# Patient Record
Sex: Male | Born: 1945 | Race: White | Hispanic: Yes | Marital: Single | Smoking: Former smoker
Health system: Southern US, Community
[De-identification: ages and names within clinical notes are randomized; demographics above are authoritative.]

## PROBLEM LIST (undated history)

## (undated) DIAGNOSIS — Z7901 Long term (current) use of anticoagulants: Secondary | ICD-10-CM

## (undated) DIAGNOSIS — Z87891 Personal history of nicotine dependence: Secondary | ICD-10-CM

## (undated) DIAGNOSIS — H409 Unspecified glaucoma: Secondary | ICD-10-CM

## (undated) DIAGNOSIS — G4733 Obstructive sleep apnea (adult) (pediatric): Secondary | ICD-10-CM

## (undated) DIAGNOSIS — E782 Mixed hyperlipidemia: Secondary | ICD-10-CM

## (undated) DIAGNOSIS — I2510 Atherosclerotic heart disease of native coronary artery without angina pectoris: Secondary | ICD-10-CM

## (undated) DIAGNOSIS — Z9989 Dependence on other enabling machines and devices: Secondary | ICD-10-CM

## (undated) DIAGNOSIS — Z950 Presence of cardiac pacemaker: Secondary | ICD-10-CM

## (undated) DIAGNOSIS — J310 Chronic rhinitis: Secondary | ICD-10-CM

## (undated) DIAGNOSIS — I4821 Permanent atrial fibrillation: Secondary | ICD-10-CM

## (undated) HISTORY — DX: Obstructive sleep apnea (adult) (pediatric): Z99.89

## (undated) HISTORY — DX: Presence of cardiac pacemaker: Z95.0

## (undated) HISTORY — DX: Unspecified glaucoma: H40.9

## (undated) HISTORY — DX: Obstructive sleep apnea (adult) (pediatric): G47.33

## (undated) HISTORY — DX: Long term (current) use of anticoagulants: Z79.01

## (undated) HISTORY — DX: Atherosclerotic heart disease of native coronary artery without angina pectoris: I25.10

## (undated) HISTORY — DX: Permanent atrial fibrillation: I48.21

## (undated) HISTORY — DX: Mixed hyperlipidemia: E78.2

## (undated) HISTORY — DX: Personal history of nicotine dependence: Z87.891

## (undated) HISTORY — DX: Chronic rhinitis: J31.0

---

## 2010-10-11 DIAGNOSIS — G4733 Obstructive sleep apnea (adult) (pediatric): Secondary | ICD-10-CM | POA: Insufficient documentation

## 2010-10-11 DIAGNOSIS — I10 Essential (primary) hypertension: Secondary | ICD-10-CM | POA: Insufficient documentation

## 2010-10-11 DIAGNOSIS — N400 Enlarged prostate without lower urinary tract symptoms: Secondary | ICD-10-CM | POA: Insufficient documentation

## 2010-10-11 DIAGNOSIS — E782 Mixed hyperlipidemia: Secondary | ICD-10-CM | POA: Insufficient documentation

## 2010-10-11 DIAGNOSIS — C4490 Unspecified malignant neoplasm of skin, unspecified: Secondary | ICD-10-CM | POA: Insufficient documentation

## 2010-10-11 DIAGNOSIS — J310 Chronic rhinitis: Secondary | ICD-10-CM | POA: Insufficient documentation

## 2010-10-11 HISTORY — DX: Benign prostatic hyperplasia without lower urinary tract symptoms: N40.0

## 2011-02-06 DIAGNOSIS — Z7901 Long term (current) use of anticoagulants: Secondary | ICD-10-CM | POA: Insufficient documentation

## 2011-02-06 DIAGNOSIS — I517 Cardiomegaly: Secondary | ICD-10-CM | POA: Insufficient documentation

## 2011-04-17 DIAGNOSIS — R32 Unspecified urinary incontinence: Secondary | ICD-10-CM | POA: Insufficient documentation

## 2011-04-24 DIAGNOSIS — E119 Type 2 diabetes mellitus without complications: Secondary | ICD-10-CM | POA: Insufficient documentation

## 2011-04-28 DIAGNOSIS — I2510 Atherosclerotic heart disease of native coronary artery without angina pectoris: Secondary | ICD-10-CM | POA: Insufficient documentation

## 2011-04-28 DIAGNOSIS — Z955 Presence of coronary angioplasty implant and graft: Secondary | ICD-10-CM | POA: Insufficient documentation

## 2011-05-04 DIAGNOSIS — H409 Unspecified glaucoma: Secondary | ICD-10-CM | POA: Insufficient documentation

## 2011-05-04 DIAGNOSIS — Z87891 Personal history of nicotine dependence: Secondary | ICD-10-CM | POA: Insufficient documentation

## 2011-06-29 DIAGNOSIS — Z7901 Long term (current) use of anticoagulants: Secondary | ICD-10-CM | POA: Insufficient documentation

## 2012-02-13 DIAGNOSIS — S0990XA Unspecified injury of head, initial encounter: Secondary | ICD-10-CM

## 2012-02-13 DIAGNOSIS — S0633AA Contusion and laceration of cerebrum, unspecified, with loss of consciousness status unknown, initial encounter: Secondary | ICD-10-CM | POA: Insufficient documentation

## 2012-02-13 HISTORY — DX: Unspecified injury of head, initial encounter: S09.90XA

## 2012-02-16 DIAGNOSIS — R55 Syncope and collapse: Secondary | ICD-10-CM | POA: Insufficient documentation

## 2012-02-16 HISTORY — DX: Syncope and collapse: R55

## 2013-08-14 DIAGNOSIS — I4892 Unspecified atrial flutter: Secondary | ICD-10-CM | POA: Insufficient documentation

## 2013-08-14 DIAGNOSIS — R9431 Abnormal electrocardiogram [ECG] [EKG]: Secondary | ICD-10-CM

## 2013-08-14 DIAGNOSIS — A419 Sepsis, unspecified organism: Secondary | ICD-10-CM | POA: Insufficient documentation

## 2013-08-14 DIAGNOSIS — N179 Acute kidney failure, unspecified: Secondary | ICD-10-CM | POA: Insufficient documentation

## 2013-08-14 DIAGNOSIS — N390 Urinary tract infection, site not specified: Secondary | ICD-10-CM | POA: Insufficient documentation

## 2013-08-14 HISTORY — DX: Abnormal electrocardiogram (ECG) (EKG): R94.31

## 2013-11-12 DIAGNOSIS — Z95818 Presence of other cardiac implants and grafts: Secondary | ICD-10-CM | POA: Insufficient documentation

## 2013-11-12 HISTORY — DX: Presence of other cardiac implants and grafts: Z95.818

## 2013-12-10 DIAGNOSIS — Z87898 Personal history of other specified conditions: Secondary | ICD-10-CM

## 2013-12-10 DIAGNOSIS — K219 Gastro-esophageal reflux disease without esophagitis: Secondary | ICD-10-CM

## 2013-12-10 DIAGNOSIS — N189 Chronic kidney disease, unspecified: Secondary | ICD-10-CM | POA: Insufficient documentation

## 2013-12-10 HISTORY — DX: Gastro-esophageal reflux disease without esophagitis: K21.9

## 2013-12-10 HISTORY — DX: Personal history of other specified conditions: Z87.898

## 2014-01-28 DIAGNOSIS — Z950 Presence of cardiac pacemaker: Secondary | ICD-10-CM | POA: Insufficient documentation

## 2014-01-28 DIAGNOSIS — I442 Atrioventricular block, complete: Secondary | ICD-10-CM | POA: Insufficient documentation

## 2014-01-28 DIAGNOSIS — I4821 Permanent atrial fibrillation: Secondary | ICD-10-CM | POA: Insufficient documentation

## 2014-01-28 HISTORY — DX: Atrioventricular block, complete: I44.2

## 2014-02-16 DIAGNOSIS — R809 Proteinuria, unspecified: Secondary | ICD-10-CM

## 2014-02-16 DIAGNOSIS — E876 Hypokalemia: Secondary | ICD-10-CM

## 2014-02-16 HISTORY — DX: Hypokalemia: E87.6

## 2014-02-16 HISTORY — DX: Proteinuria, unspecified: R80.9

## 2014-04-23 DIAGNOSIS — E1122 Type 2 diabetes mellitus with diabetic chronic kidney disease: Secondary | ICD-10-CM | POA: Insufficient documentation

## 2014-08-03 DIAGNOSIS — E6609 Other obesity due to excess calories: Secondary | ICD-10-CM | POA: Insufficient documentation

## 2014-09-17 DIAGNOSIS — N1831 Chronic kidney disease, stage 3a: Secondary | ICD-10-CM

## 2014-09-17 DIAGNOSIS — N183 Chronic kidney disease, stage 3 unspecified: Secondary | ICD-10-CM | POA: Insufficient documentation

## 2014-09-17 HISTORY — DX: Chronic kidney disease, stage 3a: N18.31

## 2016-05-10 DIAGNOSIS — Z9889 Other specified postprocedural states: Secondary | ICD-10-CM

## 2016-05-10 HISTORY — DX: Other specified postprocedural states: Z98.890

## 2016-05-11 DIAGNOSIS — Z9889 Other specified postprocedural states: Secondary | ICD-10-CM | POA: Insufficient documentation

## 2016-05-30 DIAGNOSIS — I498 Other specified cardiac arrhythmias: Secondary | ICD-10-CM

## 2016-05-30 HISTORY — DX: Other specified cardiac arrhythmias: I49.8

## 2016-09-05 DIAGNOSIS — Z45018 Encounter for adjustment and management of other part of cardiac pacemaker: Secondary | ICD-10-CM | POA: Insufficient documentation

## 2017-05-23 DIAGNOSIS — C4492 Squamous cell carcinoma of skin, unspecified: Secondary | ICD-10-CM

## 2017-05-23 HISTORY — DX: Squamous cell carcinoma of skin, unspecified: C44.92

## 2018-01-08 DIAGNOSIS — I4729 Other ventricular tachycardia: Secondary | ICD-10-CM | POA: Insufficient documentation

## 2018-01-08 HISTORY — DX: Other ventricular tachycardia: I47.29

## 2018-06-27 DIAGNOSIS — K869 Disease of pancreas, unspecified: Secondary | ICD-10-CM

## 2018-06-27 HISTORY — DX: Disease of pancreas, unspecified: K86.9

## 2019-02-27 DIAGNOSIS — I6523 Occlusion and stenosis of bilateral carotid arteries: Secondary | ICD-10-CM | POA: Insufficient documentation

## 2019-02-27 HISTORY — DX: Occlusion and stenosis of bilateral carotid arteries: I65.23

## 2019-11-08 DIAGNOSIS — R634 Abnormal weight loss: Secondary | ICD-10-CM

## 2019-11-08 HISTORY — DX: Abnormal weight loss: R63.4

## 2019-11-10 DIAGNOSIS — R634 Abnormal weight loss: Secondary | ICD-10-CM | POA: Insufficient documentation

## 2021-02-07 DIAGNOSIS — T82837A Hemorrhage of cardiac prosthetic devices, implants and grafts, initial encounter: Secondary | ICD-10-CM

## 2021-02-07 HISTORY — DX: Hemorrhage due to cardiac prosthetic devices, implants and grafts, initial encounter: T82.837A

## 2021-03-08 DIAGNOSIS — T82837A Hemorrhage of cardiac prosthetic devices, implants and grafts, initial encounter: Secondary | ICD-10-CM | POA: Insufficient documentation

## 2021-03-16 DIAGNOSIS — Z959 Presence of cardiac and vascular implant and graft, unspecified: Secondary | ICD-10-CM | POA: Insufficient documentation

## 2021-03-16 HISTORY — DX: Presence of cardiac and vascular implant and graft, unspecified: Z95.9

## 2021-08-23 DIAGNOSIS — E1142 Type 2 diabetes mellitus with diabetic polyneuropathy: Secondary | ICD-10-CM

## 2021-08-23 DIAGNOSIS — E119 Type 2 diabetes mellitus without complications: Secondary | ICD-10-CM | POA: Insufficient documentation

## 2021-08-23 HISTORY — DX: Type 2 diabetes mellitus with diabetic polyneuropathy: E11.42

## 2021-09-28 ENCOUNTER — Encounter: Payer: Self-pay | Admitting: *Deleted

## 2021-10-06 ENCOUNTER — Other Ambulatory Visit: Payer: Self-pay | Admitting: *Deleted

## 2021-10-12 ENCOUNTER — Inpatient Hospital Stay: Payer: Medicare Other | Attending: Oncology | Admitting: Oncology

## 2021-10-12 ENCOUNTER — Encounter: Payer: Self-pay | Admitting: Oncology

## 2021-10-12 ENCOUNTER — Inpatient Hospital Stay: Payer: Medicare Other

## 2021-10-12 ENCOUNTER — Encounter (INDEPENDENT_AMBULATORY_CARE_PROVIDER_SITE_OTHER): Payer: Self-pay

## 2021-10-12 VITALS — BP 138/64 | HR 59 | Temp 97.6°F | Resp 18 | Ht 73.0 in | Wt 253.8 lb

## 2021-10-12 DIAGNOSIS — N1831 Chronic kidney disease, stage 3a: Secondary | ICD-10-CM | POA: Diagnosis not present

## 2021-10-12 DIAGNOSIS — D472 Monoclonal gammopathy: Secondary | ICD-10-CM | POA: Diagnosis present

## 2021-10-12 DIAGNOSIS — J449 Chronic obstructive pulmonary disease, unspecified: Secondary | ICD-10-CM | POA: Insufficient documentation

## 2021-10-12 DIAGNOSIS — R319 Hematuria, unspecified: Secondary | ICD-10-CM | POA: Insufficient documentation

## 2021-10-12 LAB — COMPREHENSIVE METABOLIC PANEL
ALT: 29 U/L (ref 0–44)
AST: 28 U/L (ref 15–41)
Albumin: 4.2 g/dL (ref 3.5–5.0)
Alkaline Phosphatase: 57 U/L (ref 38–126)
Anion gap: 8 (ref 5–15)
BUN: 28 mg/dL — ABNORMAL HIGH (ref 8–23)
CO2: 27 mmol/L (ref 22–32)
Calcium: 9.3 mg/dL (ref 8.9–10.3)
Chloride: 100 mmol/L (ref 98–111)
Creatinine, Ser: 1.11 mg/dL (ref 0.61–1.24)
GFR, Estimated: >60 mL/min (ref >60)
Glucose, Bld: 105 mg/dL — ABNORMAL HIGH (ref 70–99)
Potassium: 3.7 mmol/L (ref 3.5–5.1)
Sodium: 135 mmol/L (ref 135–145)
Total Bilirubin: 1.3 mg/dL — ABNORMAL HIGH (ref 0.3–1.2)
Total Protein: 7.9 g/dL (ref 6.5–8.1)

## 2021-10-12 LAB — CBC WITH DIFFERENTIAL/PLATELET
Abs Immature Granulocytes: 0.04 10*3/uL (ref 0.00–0.07)
Basophils Absolute: 0 10*3/uL (ref 0.0–0.1)
Basophils Relative: 1 %
Eosinophils Absolute: 0.3 10*3/uL (ref 0.0–0.5)
Eosinophils Relative: 4 %
HCT: 44.4 % (ref 39.0–52.0)
Hemoglobin: 15.4 g/dL (ref 13.0–17.0)
Immature Granulocytes: 1 %
Lymphocytes Relative: 15 %
Lymphs Abs: 1.2 10*3/uL (ref 0.7–4.0)
MCH: 29.4 pg (ref 26.0–34.0)
MCHC: 34.7 g/dL (ref 30.0–36.0)
MCV: 84.7 fL (ref 80.0–100.0)
Monocytes Absolute: 0.8 10*3/uL (ref 0.1–1.0)
Monocytes Relative: 9 %
Neutro Abs: 6 10*3/uL (ref 1.7–7.7)
Neutrophils Relative %: 70 %
Platelets: 186 10*3/uL (ref 150–400)
RBC: 5.24 MIL/uL (ref 4.22–5.81)
RDW: 14.2 % (ref 11.5–15.5)
WBC: 8.4 10*3/uL (ref 4.0–10.5)
nRBC: 0 % (ref 0.0–0.2)

## 2021-10-12 NOTE — Progress Notes (Signed)
? ?Hematology/Oncology Consult note ?[place] ?[phone]) [phone] [phone] ? ?Patient Care Team: ?Oleh Genin, MD as PCP - General (Internal Medicine) ?Sindy Guadeloupe, MD as Consulting Physician (Hematology)  ? ?Name of the patient: Aaron Carr  ?[id number]  ?1946/05/02  ? ? ?Reason for referral-MGUS ?  ?Referring physician-Dr. Brigitte Pulse ? ?Date of visit: 10/12/21 ? ? 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  Blood work from 09/07/2021 showed a normal CBC with an H&H of 15.4/45.8.  Iron studies showed a normal TIBC SPEP showed a small M spike of 0.38 g B12 and folate were normal.  Immunofixation showed IgA kappa.  BMP showed mildly elevated serum calcium of 10.3 with a creatinine of 1.2 ? ?Patient is doing well overall for his age.  He has chronic hip pain and he ambulates with a cane.  Denies any changes in his appetite or weight ? ?ECOG PS- 2 ? ?Pain scale- 3 ? ? ?Review of systems- Review of Systems  ?Constitutional:  Positive for malaise/fatigue. Negative for chills, fever and weight loss.  ?HENT:  Negative for congestion, ear discharge and nosebleeds.   ?Eyes:  Negative for blurred vision.  ?Respiratory:  Negative for cough, hemoptysis, sputum production, shortness of breath and wheezing.   ?Cardiovascular:  Negative for chest pain, palpitations, orthopnea and claudication.  ?Gastrointestinal:  Negative for abdominal pain, blood in stool, constipation, diarrhea, heartburn, melena, nausea and vomiting.  ?Genitourinary:  Negative for dysuria, flank pain, frequency, hematuria and urgency.  ?Musculoskeletal:  Positive for joint pain. Negative for back pain and myalgias.  ?Skin:  Negative for rash.  ?Neurological:  Negative for dizziness, tingling, focal weakness, seizures, weakness and headaches.  ?Endo/Heme/Allergies:  Does not bruise/bleed  easily.  ?Psychiatric/Behavioral:  Negative for depression and suicidal ideas. The patient does not have insomnia.   ? ?No Known Allergies ? ?Patient Active Problem List  ? Diagnosis Date Noted  ? COPD (chronic obstructive pulmonary disease) ([place]) 10/12/2021  ? Hematuria 10/12/2021  ? Unintentional weight loss 11/10/2019  ? Carotid artery plaque, bilateral 02/27/2019  ? 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 Fitting or adjustment of cardiac pacemaker 09/05/2016  ? History of Mohs surgery for squamous cell carcinoma in situ of skin 05/11/2016  ? Albuminuria 02/16/2014  ? Hypokalemia 02/16/2014  ? Biventricular cardiac pacemaker in situ 01/28/2014  ? 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 CKD (chronic kidney disease) 12/10/2013  ? GERD (gastroesophageal reflux disease) 12/10/2013  ? H/O syncope 12/10/2013  ? Status post placement of implantable loop recorder 11/12/2013  ? Sepsis ([place]) 08/14/2013  ? AKI (acute kidney injury) ([place]) 08/14/2013  ? Lower urinary tract infectious disease 08/14/2013  ? Syncope 02/16/2012  ? Cerebral contusion ([place]) 02/13/2012  ? Head trauma 02/13/2012  ? Long term (current) use of anticoagulants 06/29/2011  ? Glaucoma 05/04/2011  ? CAD (coronary artery disease) 04/28/2011  ? Diabetes mellitus ([place]) 04/24/2011  ? Urinary incontinence 04/17/2011  ? Anticoagulated 02/06/2011  ? LVH (left ventricular hypertrophy) 02/06/2011  ? Skin cancer 10/11/2010  ? Benign essential hypertension 10/11/2010  ? BPH (benign prostatic hyperplasia) 10/11/2010  ? ? ? ?Past Medical History:  ?Diagnosis Date  ? Albuminuria 02/16/2014  ? Benign prostatic hyperplasia 10/11/2010  ? Biventricular cardiac pacemaker in situ   ? [address] Jude [place] [address]29 CRT-P (serial number [id number]) implanted 12/15/2013  ?  CAD (coronary artery disease)   ? Cardiac device in situ 03/16/2021  ? St Jude CRT-P implanted on 02/16/21 with St Jude RA, RV and LV leads implanted 12/15/2013  ? Carotid artery plaque,  bilateral 02/27/2019  ? Chronic kidney disease (CKD) stage G3a/A1, moderately decreased glomerular filtration rate (GFR) between 45-59 mL/min/1.73 square meter and albuminuria creatinine ratio less than 30 mg/g (HCC) 09/17/2014  ? 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 Ex-smoker   ? GERD (gastroesophageal reflux disease) 12/10/2013  ? Glaucoma   ? H/O syncope 12/10/2013  ? History of Mohs surgery for squamous cell carcinoma in situ of skin 05/2016  ? Hypokalemia 02/16/2014  ? Long term current use of anticoagulant therapy   ? Mixed hyperlipidemia   ? NSVT (nonsustained ventricular tachycardia) ([place]) 01/08/2018  ? OSA on CPAP   ? Pacemaker pocket hematoma 02/2021  ? 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 Pancreatic lesion 06/27/2018  ? Permanent atrial fibrillation ([place])   ? Prolonged Q-T interval on ECG 08/14/2013  ? Rhinitis   ? 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 [facility] posterior [facility] 01/2017  ? Type 2 diabetes mellitus with peripheral neuropathy ([place]) 08/23/2021  ? Unintentional weight loss 11/2019  ? ? ? ?History reviewed. No pertinent surgical history. ? ?Social History  ? ?Socioeconomic History  ? Marital status: Single  ?  Spouse name: Not on file  ? Number of children: Not on file  ? Years of education: Not on file  ? Highest education level: Not on file  ?Occupational History  ? Not on file  ?Tobacco Use  ? Smoking status: Former  ?  Packs/day: 3.00  ?  Years: 21.00  ?  Pack years: 63.00  ?  Types: Cigarettes  ?  Start date: 07/10/1968  ?  Quit date: 07/09/1998  ?  Years since quitting: 23.2  ?  Passive exposure: Never  ? Smokeless tobacco: Never  ?Vaping Use  ? Vaping Use: Never used  ?Substance and Sexual Activity  ? Alcohol use: Not Currently  ?  Alcohol/week: 1.0 standard drink  ?  Types: 1 Glasses of wine per week  ?  Comment: "social drinker wine consumption" 2-4 times a month  ? Drug  use: Not on file  ? Sexual activity: Not on file  ?Other Topics Concern  ? Not on file  ?Social History Narrative  ? Not on file  ? ?Social Determinants of Health  ? ?Financial Resource Strain: Not on file  ?Food Insecurity: Not on file  ?Transportation Needs: Not on file  ?Physical Activity: Not on file  ?Stress: Not on file  ?Social Connections: Not on file  ?Intimate Partner Violence: Not on file  ? ?  ?Family History  ?Problem Relation Age of Onset  ? Emphysema Father   ? Glaucoma Father   ? Bipolar disorder Brother   ? Skin cancer Brother   ? Skin cancer Brother   ? ? ? ?Current Outpatient Medications:  ?  amLODipine (NORVASC) 10 MG tablet, Take 1 tablet by mouth daily., Disp: , Rfl:  ?  atorvastatin (LIPITOR) 40 MG tablet, Take 1 tablet by mouth daily at 12 noon., Disp: , Rfl:  ?  doxazosin (CARDURA) 4 MG tablet, Take 1 tablet by mouth daily at 12 noon., Disp: , Rfl:  ?  fluticasone (FLONASE) 50 MCG/ACT nasal spray, Place 2 sprays into the nose daily at  12 noon., Disp: , Rfl:  ?  glipiZIDE (GLUCOTROL) 5 MG tablet, Take 1 tablet by mouth daily at 12 noon., Disp: , Rfl:  ?  losartan (COZAAR) 25 MG tablet, Take 1 tablet by mouth daily., Disp: , Rfl:  ?  umeclidinium-vilanterol (ANORO ELLIPTA) 62.5-25 MCG/ACT AEPB, Inhale into the lungs., Disp: , Rfl:  ?  warfarin (COUMADIN) 5 MG tablet, Take 1 tablet by mouth daily at 12 noon., Disp: , Rfl:  ?  albuterol (VENTOLIN HFA) 108 (90 Base) MCG/ACT inhaler, Inhale 2 puffs into the lungs daily as needed for wheezing. (Patient not taking: Reported on 10/12/2021), Disp: , Rfl:  ?  guaiFENesin (MUCINEX) 600 MG 12 hr tablet, Take 1 tablet by mouth every 12 (twelve) hours. (Patient not taking: Reported on 10/12/2021), Disp: , Rfl:  ?  ibuprofen (ADVIL) 600 MG tablet, Take 600 mg by mouth every 6 (six) hours. (Patient not taking: Reported on 10/12/2021), Disp: , Rfl:  ? ? ?Physical exam:  ?Vitals:  ? 10/12/21 1352  ?BP: 138/64  ?Pulse: (!) 59  ?Resp: 18  ?Temp: 97.6 ?F (36.4 ?C)   ?SpO2: 97%  ?Weight: 253 lb 12.8 oz (115.1 kg)  ?Height: '6\' 1"'$  (1.854 m)  ? ?Physical Exam ?Constitutional:   ?   General: He is not in acute distress. ?Cardiovascular:  ?   Rate and Rhythm: Normal rate and regula

## 2021-10-13 ENCOUNTER — Encounter: Payer: Self-pay | Admitting: Oncology

## 2021-10-13 LAB — KAPPA/LAMBDA LIGHT CHAINS
Kappa free light chain: 392.2 mg/L — ABNORMAL HIGH (ref 3.3–19.4)
Kappa, lambda light chain ratio: 25.63 — ABNORMAL HIGH (ref 0.26–1.65)
Lambda free light chains: 15.3 mg/L (ref 5.7–26.3)

## 2021-10-14 LAB — MULTIPLE MYELOMA PANEL, SERUM
Albumin SerPl Elph-Mcnc: 4 g/dL (ref 2.9–4.4)
Albumin/Glob SerPl: 1.3 (ref 0.7–1.7)
Alpha 1: 0.2 g/dL (ref 0.0–0.4)
Alpha2 Glob SerPl Elph-Mcnc: 0.8 g/dL (ref 0.4–1.0)
B-Globulin SerPl Elph-Mcnc: 0.9 g/dL (ref 0.7–1.3)
Gamma Glob SerPl Elph-Mcnc: 1.1 g/dL (ref 0.4–1.8)
Globulin, Total: 3.1 g/dL (ref 2.2–3.9)
IgA: 556 mg/dL — ABNORMAL HIGH (ref 61–437)
IgG (Immunoglobin G), Serum: 981 mg/dL (ref 603–1613)
IgM (Immunoglobulin M), Srm: 32 mg/dL (ref 15–143)
M Protein SerPl Elph-Mcnc: 0.4 g/dL — ABNORMAL HIGH
Total Protein ELP: 7.1 g/dL (ref 6.0–8.5)

## 2021-10-16 DIAGNOSIS — D472 Monoclonal gammopathy: Secondary | ICD-10-CM | POA: Diagnosis not present

## 2021-10-17 ENCOUNTER — Other Ambulatory Visit: Payer: Self-pay

## 2021-10-17 DIAGNOSIS — D472 Monoclonal gammopathy: Secondary | ICD-10-CM

## 2021-10-20 LAB — IFE+PROTEIN ELECTRO, 24-HR UR
% BETA, Urine: 13.1 %
ALPHA 1 URINE: 3.2 %
Albumin, U: 61.6 %
Alpha 2, Urine: 3.9 %
GAMMA GLOBULIN URINE: 18.2 %
M-SPIKE %, Urine: 8.2 % — ABNORMAL HIGH
M-Spike, Mg/24 Hr: 76 mg/24 hr — ABNORMAL HIGH
Total Protein, Urine-Ur/day: 927 mg/24 hr — ABNORMAL HIGH (ref 30–150)
Total Protein, Urine: 40.3 mg/dL
Total Volume: 2300

## 2021-10-21 ENCOUNTER — Telehealth: Payer: Self-pay | Admitting: Oncology

## 2021-10-21 ENCOUNTER — Other Ambulatory Visit: Payer: Self-pay | Admitting: *Deleted

## 2021-10-21 DIAGNOSIS — D472 Monoclonal gammopathy: Secondary | ICD-10-CM

## 2021-10-21 NOTE — Telephone Encounter (Signed)
Thank you, I just put in the order today ?

## 2021-10-21 NOTE — Progress Notes (Signed)
Aaron Carr called pt and I put order in and they was given that it does not need appt , just go sometime in few weeks and it get done before MD appt ?

## 2021-10-21 NOTE — Telephone Encounter (Signed)
Spoke with patient's daughter to let her know that patient needs a Bone survey prior to his appointment to see Dr. Janese Banks on 5/2. I explained that this is a walk in and does not require an appointment. I had left a voicemail with patient but wanted to make sure he got the message. She will confirm with patient.  ?

## 2021-10-31 ENCOUNTER — Ambulatory Visit
Admission: RE | Admit: 2021-10-31 | Discharge: 2021-10-31 | Disposition: A | Discharge status: Home / Self Care | Payer: Medicare Other | Source: Ambulatory Visit | Attending: Oncology | Admitting: Oncology

## 2021-10-31 ENCOUNTER — Ambulatory Visit: Payer: Medicare Other | Admitting: Oncology

## 2021-10-31 DIAGNOSIS — D472 Monoclonal gammopathy: Secondary | ICD-10-CM | POA: Insufficient documentation

## 2021-11-08 ENCOUNTER — Inpatient Hospital Stay: Payer: Medicare Other | Attending: Oncology | Admitting: Oncology

## 2021-11-08 ENCOUNTER — Encounter: Payer: Self-pay | Admitting: Oncology

## 2021-11-08 VITALS — BP 124/74 | HR 60 | Resp 20 | Wt 255.2 lb

## 2021-11-08 DIAGNOSIS — R768 Other specified abnormal immunological findings in serum: Secondary | ICD-10-CM | POA: Diagnosis not present

## 2021-11-08 DIAGNOSIS — D472 Monoclonal gammopathy: Secondary | ICD-10-CM | POA: Diagnosis present

## 2021-11-08 DIAGNOSIS — N1831 Chronic kidney disease, stage 3a: Secondary | ICD-10-CM | POA: Insufficient documentation

## 2021-11-08 DIAGNOSIS — Z87891 Personal history of nicotine dependence: Secondary | ICD-10-CM | POA: Insufficient documentation

## 2021-11-09 ENCOUNTER — Telehealth: Payer: Self-pay

## 2021-11-09 NOTE — Telephone Encounter (Signed)
Pt understands and agrees with BMB instructions arrival time at 7:30am and NPO 6-8 hours prior except heart/seizure/BP medications if applicable.  ? ?

## 2021-11-11 NOTE — Progress Notes (Signed)
? ? ? ?Hematology/Oncology Consult note ?[place]  ?Telephone:(336) [phone] [phone] ? ?Patient Care Team: ?Oleh Genin, MD as PCP - General (Internal Medicine) ?Sindy Guadeloupe, MD as Consulting Physician (Hematology)  ? ?Name of the patient: Aaron Carr  ?[id number]  ?October 26, 1945  ? ?Date of visit: 11/11/21 ? ?Diagnosis-IgA kappa MGUS ? ?Chief complaint/ Reason for visit-discuss results of blood work ? ?Heme/Onc history: Patient is a 76 year old male with a past medical history significant for CKD, CAD, BPH, hyperlipidemia who has been referred for MGUS.  Blood work from 09/07/2021 showed a normal CBC with an H&H of 15.4/45.8.  Iron studies showed a normal TIBC SPEP showed a small M spike of 0.38 g B12 and folate were normal.  Immunofixation showed IgA kappa.  BMP showed mildly elevated serum calcium of 10.3 with a creatinine of 1.2 ?  ? ?Interval history-patient feels at his baseline state of health.  Denies any new complaints at this time other than chronic fatigue and joint pain ? ?ECOG PS- 1 ?Pain scale- 2 ?Opioid associated constipation- no ? ?Review of systems- Review of Systems  ?Constitutional:  Positive for malaise/fatigue. Negative for chills, fever and weight loss.  ?HENT:  Negative for congestion, ear discharge and nosebleeds.   ?Eyes:  Negative for blurred vision.  ?Respiratory:  Negative for cough, hemoptysis, sputum production, shortness of breath and wheezing.   ?Cardiovascular:  Negative for chest pain, palpitations, orthopnea and claudication.  ?Gastrointestinal:  Negative for abdominal pain, blood in stool, constipation, diarrhea, heartburn, melena, nausea and vomiting.  ?Genitourinary:  Negative for dysuria, flank pain, frequency, hematuria and urgency.  ?Musculoskeletal:  Positive for joint pain. Negative for back pain and myalgias.  ?Skin:  Negative for rash.  ?Neurological:  Negative for dizziness, tingling, focal weakness, seizures, weakness and  headaches.  ?Endo/Heme/Allergies:  Does not bruise/bleed easily.  ?Psychiatric/Behavioral:  Negative for depression and suicidal ideas. The patient does not have insomnia.    ? ? ? ?No Known Allergies ? ? ?Past Medical History:  ?Diagnosis Date  ? Albuminuria 02/16/2014  ? Benign prostatic hyperplasia 10/11/2010  ? Biventricular cardiac pacemaker in situ   ? [address] Jude [place] [address]606 CRT-P (serial number [id number]) implanted 12/15/2013  ? CAD (coronary artery disease)   ? Cardiac device in situ 03/16/2021  ? St Jude CRT-P implanted on 02/16/21 with St Jude RA, RV and LV leads implanted 12/15/2013  ? Carotid artery plaque, bilateral 02/27/2019  ? Chronic kidney disease (CKD) stage G3a/A1, moderately decreased glomerular filtration rate (GFR) between 45-59 mL/min/1.73 square meter and albuminuria creatinine ratio less than 30 mg/g ([facility]CC) 09/17/2014  ? 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 Ex-smoker   ? GERD (gastroesophageal reflux disease) 12/10/2013  ? Glaucoma   ? H/O syncope 12/10/2013  ? History of Mohs surgery for squamous cell carcinoma in situ of skin 05/2016  ? Hypokalemia 02/16/2014  ? Long term current use of anticoagulant therapy   ? Mixed hyperlipidemia   ? NSVT (nonsustained ventricular tachycardia) ([place]) 01/08/2018  ? OSA on CPAP   ? Pacemaker pocket hematoma 02/2021  ? 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 Pancreatic lesion 06/27/2018  ? Permanent atrial fibrillation ([place])   ? Prolonged Q-T interval on ECG 08/14/2013  ? Rhinitis   ? 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 [facility] posterior [facility] 01/2017  ? Type 2 diabetes mellitus with  peripheral neuropathy ([place]) 08/23/2021  ? Unintentional weight loss 11/2019  ? ? ? ?History reviewed. No pertinent surgical history. ? ?Social History  ? ?Socioeconomic History  ? Marital status: Single  ?  Spouse name: Not on file  ? Number of children: Not  on file  ? Years of education: Not on file  ? Highest education level: Not on file  ?Occupational History  ? Not on file  ?Tobacco Use  ? Smoking status: Former  ?  Packs/day: 3.00  ?  Years: 21.00  ?  Pack years: 63.00  ?  Types: Cigarettes  ?  Start date: 07/10/1968  ?  Quit date: 07/09/1998  ?  Years since quitting: 23.3  ?  Passive exposure: Never  ? Smokeless tobacco: Never  ?Vaping Use  ? Vaping Use: Never used  ?Substance and Sexual Activity  ? Alcohol use: Not Currently  ?  Alcohol/week: 1.0 standard drink  ?  Types: 1 Glasses of wine per week  ?  Comment: "social drinker wine consumption" 2-4 times a month  ? Drug use: Not on file  ? Sexual activity: Not on file  ?Other Topics Concern  ? Not on file  ?Social History Narrative  ? Not on file  ? ?Social Determinants of Health  ? ?Financial Resource Strain: Not on file  ?Food Insecurity: Not on file  ?Transportation Needs: Not on file  ?Physical Activity: Not on file  ?Stress: Not on file  ?Social Connections: Not on file  ?Intimate Partner Violence: Not on file  ? ? ?Family History  ?Problem Relation Age of Onset  ? Emphysema Father   ? Glaucoma Father   ? Bipolar disorder Brother   ? Skin cancer Brother   ? Skin cancer Brother   ? ? ? ?Current Outpatient Medications:  ?  amLODipine (NORVASC) 10 MG tablet, Take 1 tablet by mouth daily., Disp: , Rfl:  ?  atorvastatin (LIPITOR) 40 MG tablet, Take 1 tablet by mouth daily at 12 noon., Disp: , Rfl:  ?  doxazosin (CARDURA) 4 MG tablet, Take 1 tablet by mouth daily at 12 noon., Disp: , Rfl:  ?  glipiZIDE (GLUCOTROL) 5 MG tablet, Take 1 tablet by mouth daily at 12 noon., Disp: , Rfl:  ?  losartan (COZAAR) 25 MG tablet, Take 1 tablet by mouth daily., Disp: , Rfl:  ?  warfarin (COUMADIN) 5 MG tablet, Take 1 tablet by mouth daily at 12 noon., Disp: , Rfl:  ?  albuterol (VENTOLIN HFA) 108 (90 Base) MCG/ACT inhaler, Inhale 2 puffs into the lungs daily as needed for wheezing. (Patient not taking: Reported on 10/12/2021), Disp:  , Rfl:  ?  fluticasone (FLONASE) 50 MCG/ACT nasal spray, Place 2 sprays into the nose daily at 12 noon. (Patient not taking: Reported on 11/08/2021), Disp: , Rfl:  ?  guaiFENesin (MUCINEX) 600 MG 12 hr tablet, Take 1 tablet by mouth every 12 (twelve) hours. (Patient not taking: Reported on 10/12/2021), Disp: , Rfl:  ?  ibuprofen (ADVIL) 600 MG tablet, Take 600 mg by mouth every 6 (six) hours. (Patient not taking: Reported on 10/12/2021), Disp: , Rfl:  ?  umeclidinium-vilanterol (ANORO ELLIPTA) 62.5-25 MCG/ACT AEPB, Inhale into the lungs. (Patient not taking: Reported on 11/08/2021), Disp: , Rfl:  ? ?Physical exam:  ?Vitals:  ? 11/08/21 1436  ?BP: 124/74  ?Pulse: 60  ?Resp: 20  ?SpO2: 95%  ?Weight: 255 lb 3.2 oz (115.8 kg)  ? ?Physical Exam ?Constitutional:   ?   General:  He is not in acute distress. ?   Comments: Ambulates with a cane.  Appears in no acute distress  ?Cardiovascular:  ?   Rate and Rhythm: Normal rate and regular rhythm.  ?   Heart sounds: Normal heart sounds.  ?Pulmonary:  ?   Effort: Pulmonary effort is normal.  ?   Breath sounds: Normal breath sounds.  ?Abdominal:  ?   General: Bowel sounds are normal.  ?   Palpations: Abdomen is soft.  ?Skin: ?   General: Skin is warm and dry.  ?Neurological:  ?   Mental Status: He is alert and oriented to person, place, and time.  ?  ? ? ?  Latest Ref Rng & Units 10/12/2021  ?  2:15 PM  ?CMP  ?Glucose 70 - 99 mg/dL 105    ?BUN 8 - 23 mg/dL 28    ?Creatinine 0.61 - 1.24 mg/dL 1.11    ?Sodium 135 - 145 mmol/L 135    ?Potassium 3.5 - 5.1 mmol/L 3.7    ?Chloride 98 - 111 mmol/L 100    ?CO2 22 - 32 mmol/L 27    ?Calcium 8.9 - 10.3 mg/dL 9.3    ?Total Protein 6.5 - 8.1 g/dL 7.9    ?Total Bilirubin 0.3 - 1.2 mg/dL 1.3    ?Alkaline Phos 38 - 126 U/L 57    ?AST 15 - 41 U/L 28    ?ALT 0 - 44 U/L 29    ? ? ?  Latest Ref Rng & Units 10/12/2021  ?  2:15 PM  ?CBC  ?WBC 4.0 - 10.5 K/uL 8.4    ?Hemoglobin 13.0 - 17.0 g/dL 15.4    ?Hematocrit 39.0 - 52.0 % 44.4    ?Platelets 150 - 400 K/uL  186    ? ? ?No images are attached to the encounter. ? ?DG Bone Survey Met ? ?Result Date: 11/01/2021 ?CLINICAL DATA:  Monoclonal gammopathy EXAM: METASTATIC BONE SURVEY COMPARISON:  None. FINDINGS: Orlene Plum

## 2021-11-17 ENCOUNTER — Other Ambulatory Visit: Payer: Self-pay | Admitting: Internal Medicine

## 2021-11-17 DIAGNOSIS — C4492 Squamous cell carcinoma of skin, unspecified: Secondary | ICD-10-CM

## 2021-11-17 NOTE — H&P (Signed)
? ?Chief Complaint: ?Patient was seen in consultation today for bone marrow biopsy with aspiration ? ?Referring Physician(s): ?Rao,Archana C ? ?Supervising Physician: Markus Daft ? ?Patient Status: [place] ? ?History of Present Illness: ?Aaron Carr is a 76 y.o. male with a medical history significant for BPH, CAD, atrial fibrillation, AV block, cardiac pacemaker, CKD and DM. 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  Recent lab work showed an increase in kappa light chain ratio with elevated serum free kappa and there is concern for multiple myeloma. ? ?Interventional Radiology has been asked to evaluate this patient for an image-guided bone marrow biopsy for further work up.  ? ?Past Medical History:  ?Diagnosis Date  ? Albuminuria 02/16/2014  ? Benign prostatic hyperplasia 10/11/2010  ? Biventricular cardiac pacemaker in situ   ? [address] Jude [place] 1610 CRT-P (serial number [id number]) implanted 12/15/2013  ? CAD (coronary artery disease)   ? Cardiac device in situ 03/16/2021  ? St Jude CRT-P implanted on 02/16/21 with St Jude RA, RV and LV leads implanted 12/15/2013  ? Carotid artery plaque, bilateral 02/27/2019  ? Chronic kidney disease (CKD) stage G3a/A1, moderately decreased glomerular filtration rate (GFR) between 45-59 mL/min/1.73 square meter and albuminuria creatinine ratio less than 30 mg/g (HCC) 09/17/2014  ? 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 Ex-smoker   ? GERD (gastroesophageal reflux disease) 12/10/2013  ? Glaucoma   ? H/O syncope 12/10/2013  ? History of Mohs surgery for squamous cell carcinoma in situ of skin 05/2016  ? Hypokalemia 02/16/2014  ? Long term current use of anticoagulant therapy   ? Mixed hyperlipidemia   ? NSVT (nonsustained ventricular tachycardia) ([place]) 01/08/2018  ? OSA on CPAP   ? Pacemaker pocket hematoma 02/2021  ? 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 Pancreatic lesion 06/27/2018  ? Permanent atrial fibrillation ([place])   ? Prolonged Q-T interval on ECG 08/14/2013  ? Rhinitis   ? 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 Left posterior shoulder s/[facility] 01/2017  ? Type 2 diabetes mellitus with peripheral neuropathy ([place]) 08/23/2021  ? Unintentional weight loss 11/2019  ? ? ?History reviewed. No pertinent surgical history. ? ?Allergies: ?Patient has no known allergies. ? ?Medications: ?Prior to Admission medications   ?Medication Sig Start Date End Date Taking? Authorizing Provider  ?albuterol (VENTOLIN HFA) 108 (90 Base) MCG/ACT inhaler Inhale 2 puffs into the lungs daily as needed for wheezing. ?Patient not taking: Reported on 10/12/2021 03/11/19 01/27/22  [provider]  ?amLODipine (NORVASC) 10 MG tablet Take 1 tablet by mouth daily. 06/25/14   [provider]  ?atorvastatin (LIPITOR) 40 MG tablet Take 1 tablet by mouth daily at 12 noon. 09/13/20   [provider]  ?doxazosin (CARDURA) 4 MG tablet Take 1 tablet by mouth daily at 12 noon. 03/18/21   [provider]  ?fluticasone (FLONASE) 50 MCG/ACT nasal spray Place 2 sprays into the nose daily at 12 noon. ?Patient not taking: Reported on 11/08/2021 08/23/21 08/23/22  [provider]  ?glipiZIDE (GLUCOTROL) 5 MG tablet Take 1 tablet by mouth daily at 12 noon. 02/01/21   [provider]  ?guaiFENesin (MUCINEX) 600 MG 12 hr tablet Take 1 tablet by mouth every 12 (twelve) hours. ?Patient not taking: Reported on 10/12/2021    [provider]  ?ibuprofen (ADVIL) 600 MG tablet Take  600 mg by mouth every 6 (six) hours. ?Patient not taking: Reported on 10/12/2021 07/21/21   [provider]  ?losartan (COZAAR) 25 MG tablet Take 1 tablet by mouth daily. 04/06/21   [provider]  ?umeclidinium-vilanterol (ANORO ELLIPTA) 62.5-25 MCG/ACT AEPB Inhale into the lungs. ?Patient not taking:  Reported on 11/08/2021 11/25/19   [provider]  ?warfarin (COUMADIN) 5 MG tablet Take 1 tablet by mouth daily at 12 noon. 06/29/14   [provider]  ?  ? ?Family History  ?Problem Relation Age of Onset  ? Emphysema Father   ? Glaucoma Father   ? Bipolar disorder Brother   ? Skin cancer Brother   ? Skin cancer Brother   ? ? ?Social History  ? ?Socioeconomic History  ? Marital status: Single  ?  Spouse name: Not on file  ? Number of children: Not on file  ? Years of education: Not on file  ? Highest education level: Not on file  ?Occupational History  ? Not on file  ?Tobacco Use  ? Smoking status: Former  ?  Packs/day: 3.00  ?  Years: 21.00  ?  Pack years: 63.00  ?  Types: Cigarettes  ?  Start date: 07/10/1968  ?  Quit date: 07/09/1998  ?  Years since quitting: 23.3  ?  Passive exposure: Never  ? Smokeless tobacco: Never  ?Vaping Use  ? Vaping Use: Never used  ?Substance and Sexual Activity  ? Alcohol use: Not Currently  ?  Alcohol/week: 1.0 standard drink  ?  Types: 1 Glasses of wine per week  ?  Comment: "social drinker wine consumption" 2-4 times a month  ? Drug use: Never  ? Sexual activity: Not on file  ?Other Topics Concern  ? Not on file  ?Social History Narrative  ? Not on file  ? ?Social Determinants of Health  ? ?Financial Resource Strain: Not on file  ?Food Insecurity: Not on file  ?Transportation Needs: Not on file  ?Physical Activity: Not on file  ?Stress: Not on file  ?Social Connections: Not on file  ? ? ?Review of Systems: A 12 point ROS discussed and pertinent positives are indicated in the HPI above.  All other systems are negative. ? ?Review of Systems  ?Constitutional:  Negative for appetite change and fatigue.  ?Respiratory:  Negative for cough and shortness of breath.   ?Cardiovascular:  Negative for chest pain and leg swelling.  ?Gastrointestinal:  Negative for abdominal pain, diarrhea, nausea and vomiting.  ?Musculoskeletal:  Negative for back pain.  ?Neurological:  Negative  for headaches.  ?Hematological:  Negative for adenopathy.  ? ?Vital Signs: ?BP (!) 145/80   Pulse 61   Temp 98 ?F (36.7 ?C) (Oral)   Resp 15   Ht 6' 1"  (1.854 m)   Wt 240 lb (108.9 kg)   SpO2 95%   BMI 31.66 kg/m?  ? ?Physical Exam ?Constitutional:   ?   General: He is not in acute distress. ?   Appearance: He is not ill-appearing.  ?HENT:  ?   Mouth/Throat:  ?   Mouth: Mucous membranes are moist.  ?   Pharynx: Oropharynx is clear.  ?Cardiovascular:  ?   Rate and Rhythm: Normal rate and regular rhythm.  ?   Pulses: Normal pulses.  ?   Heart sounds: Normal heart sounds.  ?   Comments: Left upper chest pacemaker - site is unremarkable.  ?Pulmonary:  ?   Effort: Pulmonary effort is normal.  ?  Breath sounds: Normal breath sounds.  ?Abdominal:  ?   General: Bowel sounds are normal.  ?   Palpations: Abdomen is soft.  ?Musculoskeletal:  ?   Right lower leg: No edema.  ?   Left lower leg: No edema.  ?Skin: ?   General: Skin is warm and dry.  ?Neurological:  ?   Mental Status: He is alert and oriented to person, place, and time.  ? ? ?Imaging: ?DG Bone Survey Met ? ?Result Date: 11/01/2021 ?CLINICAL DATA:  Monoclonal gammopathy EXAM: METASTATIC BONE SURVEY COMPARISON:  None. FINDINGS: Lung fields are clear of any infiltrates or nodules. There is no pleural effusion or pneumothorax. Pacemaker battery is seen in the left infraclavicular region. Pacer leads are noted with 1 of them lying in the region of coronary sinus. Coronary artery calcifications are seen. No focal lytic lesions are seen in the bony structures. Degenerative changes are noted with bony spurs in the cervical, thoracic and lumbar spine. Small bony spurs seen in the both knees. There are multiple calcified gallbladder stones. Fairly extensive arterial calcifications are seen. IMPRESSION: There are no focal lytic or sclerotic lesions in bony structures. Gallbladder stones.  There are no focal pulmonary infiltrates. Other findings as described in the body  of the report. Electronically Signed   By: Elmer Picker M.D.   On: 11/01/2021 13:20   ? ?Labs: ? ?CBC: ?Recent Labs  ?  10/12/21 ?1415  ?WBC 8.4  ?HGB 15.4  ?HCT 44.4  ?PLT 186  ? ? ?COAGS: ?No results for input(s):

## 2021-11-18 ENCOUNTER — Ambulatory Visit
Admission: RE | Admit: 2021-11-18 | Discharge: 2021-11-18 | Disposition: A | Discharge status: Home / Self Care | Payer: Medicare Other | Source: Ambulatory Visit | Attending: Oncology | Admitting: Oncology

## 2021-11-18 DIAGNOSIS — I2510 Atherosclerotic heart disease of native coronary artery without angina pectoris: Secondary | ICD-10-CM | POA: Diagnosis not present

## 2021-11-18 DIAGNOSIS — R898 Other abnormal findings in specimens from other organs, systems and tissues: Secondary | ICD-10-CM | POA: Insufficient documentation

## 2021-11-18 DIAGNOSIS — I442 Atrioventricular block, complete: Secondary | ICD-10-CM | POA: Insufficient documentation

## 2021-11-18 DIAGNOSIS — C9000 Multiple myeloma not having achieved remission: Secondary | ICD-10-CM | POA: Insufficient documentation

## 2021-11-18 DIAGNOSIS — D472 Monoclonal gammopathy: Secondary | ICD-10-CM | POA: Diagnosis not present

## 2021-11-18 DIAGNOSIS — E1122 Type 2 diabetes mellitus with diabetic chronic kidney disease: Secondary | ICD-10-CM | POA: Diagnosis not present

## 2021-11-18 DIAGNOSIS — Z7984 Long term (current) use of oral hypoglycemic drugs: Secondary | ICD-10-CM | POA: Diagnosis not present

## 2021-11-18 DIAGNOSIS — Z950 Presence of cardiac pacemaker: Secondary | ICD-10-CM | POA: Insufficient documentation

## 2021-11-18 DIAGNOSIS — I4821 Permanent atrial fibrillation: Secondary | ICD-10-CM | POA: Diagnosis not present

## 2021-11-18 DIAGNOSIS — N1831 Chronic kidney disease, stage 3a: Secondary | ICD-10-CM | POA: Diagnosis not present

## 2021-11-18 DIAGNOSIS — C4492 Squamous cell carcinoma of skin, unspecified: Secondary | ICD-10-CM | POA: Insufficient documentation

## 2021-11-18 DIAGNOSIS — Q998 Other specified chromosome abnormalities: Secondary | ICD-10-CM | POA: Insufficient documentation

## 2021-11-18 DIAGNOSIS — N400 Enlarged prostate without lower urinary tract symptoms: Secondary | ICD-10-CM | POA: Diagnosis not present

## 2021-11-18 LAB — CBC WITH DIFFERENTIAL/PLATELET
Abs Immature Granulocytes: 0.02 10*3/uL (ref 0.00–0.07)
Basophils Absolute: 0 10*3/uL (ref 0.0–0.1)
Basophils Relative: 0 %
Eosinophils Absolute: 0.2 10*3/uL (ref 0.0–0.5)
Eosinophils Relative: 3 %
HCT: 45.2 % (ref 39.0–52.0)
Hemoglobin: 15.3 g/dL (ref 13.0–17.0)
Immature Granulocytes: 0 %
Lymphocytes Relative: 15 %
Lymphs Abs: 1.1 10*3/uL (ref 0.7–4.0)
MCH: 28.8 pg (ref 26.0–34.0)
MCHC: 33.8 g/dL (ref 30.0–36.0)
MCV: 85 fL (ref 80.0–100.0)
Monocytes Absolute: 0.7 10*3/uL (ref 0.1–1.0)
Monocytes Relative: 9 %
Neutro Abs: 5.4 10*3/uL (ref 1.7–7.7)
Neutrophils Relative %: 73 %
Platelets: 163 10*3/uL (ref 150–400)
RBC: 5.32 MIL/uL (ref 4.22–5.81)
RDW: 14.1 % (ref 11.5–15.5)
WBC: 7.5 10*3/uL (ref 4.0–10.5)
nRBC: 0 % (ref 0.0–0.2)

## 2021-11-18 MED ORDER — HEPARIN SOD (PORK) LOCK FLUSH 100 UNIT/ML IV SOLN
INTRAVENOUS | Status: AC
  Filled 2021-11-18: qty 5

## 2021-11-18 MED ORDER — SODIUM CHLORIDE 0.9 % IV SOLN
INTRAVENOUS | Status: DC
  Administered 2021-11-18: 1000 mL via INTRAVENOUS

## 2021-11-18 MED ORDER — MIDAZOLAM HCL 2 MG/2ML IJ SOLN
INTRAMUSCULAR | Status: AC
  Filled 2021-11-18: qty 2

## 2021-11-18 MED ORDER — FENTANYL CITRATE (PF) 100 MCG/2ML IJ SOLN
INTRAMUSCULAR | Status: AC
Start: 2021-11-18 — End: 2021-11-18
  Filled 2021-11-18: qty 2

## 2021-11-18 MED ORDER — FENTANYL CITRATE (PF) 100 MCG/2ML IJ SOLN
INTRAMUSCULAR | Status: AC | PRN
  Administered 2021-11-18: 50 ug via INTRAVENOUS

## 2021-11-18 MED ORDER — MIDAZOLAM HCL 2 MG/2ML IJ SOLN
INTRAMUSCULAR | Status: AC | PRN
  Administered 2021-11-18: 1 mg via INTRAVENOUS

## 2021-11-18 NOTE — Procedures (Signed)
Interventional Radiology Procedure:   Indications: MGUS  Procedure: CT guided bone marrow biopsy  Findings: 2 aspirates and 2 cores from right ilium  Complications: None     EBL: Minimal, less than 10 ml  Plan: Discharge to home in one hour.   Briani Maul R. Braylea Brancato, MD  Pager: [phone]   

## 2021-11-23 LAB — SURGICAL PATHOLOGY

## 2021-11-29 ENCOUNTER — Inpatient Hospital Stay (HOSPITAL_BASED_OUTPATIENT_CLINIC_OR_DEPARTMENT_OTHER): Payer: Medicare Other | Admitting: Oncology

## 2021-11-29 ENCOUNTER — Encounter: Payer: Self-pay | Admitting: Oncology

## 2021-11-29 VITALS — BP 130/62 | HR 56 | Temp 97.6°F | Resp 18 | Wt 250.1 lb

## 2021-11-29 DIAGNOSIS — D472 Monoclonal gammopathy: Secondary | ICD-10-CM

## 2021-11-30 ENCOUNTER — Encounter (HOSPITAL_COMMUNITY): Payer: Self-pay | Admitting: Oncology

## 2021-12-05 NOTE — Progress Notes (Signed)
Hematology/Oncology Consult note [facility]  Telephone:([phone] [phone]  Patient Care Team: Oleh Genin, MD as PCP - General (Internal Medicine) Sindy Guadeloupe, MD as Consulting Physician (Hematology)   Name of the patient: Aaron Carr  [id number]  [birth year]   Date of visit: 12/05/21  Diagnosis- IgA kappa MGUS  Chief complaint/ Reason for visit-discuss results of bone marrow biopsy  Heme/Onc history: Patient is a 76 year old male with a past medical history significant for CKD, CAD, BPH, hyperlipidemia who has been referred for MGUS.  Blood work from 09/07/2021 showed a normal CBC with an H&H of 15.4/45.8.  Iron studies showed a normal TIBC SPEP showed a small M spike of 0.38 g B12 and folate were normal.  Immunofixation showed IgA kappa.  BMP showed mildly elevated serum calcium of 10.3 with a creatinine of 1.2  Results of blood work from 4 11/27/2018 showed a normal CBC with an H&H of 15.4/44.4.  Serum creatinine is normal at 1.1 with a calcium of 9.3.  Total protein normal at 7.9.  Myeloma panel shows a small IgA kappa M protein of 0.4 g.  However kappa light chain ratio is elevated at 25 with an elevated serum free kappa of 392. Results of bone marrow biopsy showed hypercellular marrow involved with plasma cell neoplasm 10%.  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  ECOG PS- 1 Pain scale- 0   Review of systems- Review of Systems  Constitutional:  Positive for malaise/fatigue. Negative for chills, fever and weight loss.  HENT:  Negative for congestion, ear discharge and nosebleeds.   Eyes:  Negative for blurred vision.  Respiratory:  Negative for cough, hemoptysis, sputum production, shortness of breath and wheezing.   Cardiovascular:  Negative for chest pain, palpitations,  orthopnea and claudication.  Gastrointestinal:  Negative for abdominal pain, blood in stool, constipation, diarrhea, heartburn, melena, nausea and vomiting.  Genitourinary:  Negative for dysuria, flank pain, frequency, hematuria and urgency.  Musculoskeletal:  Negative for back pain, joint pain and myalgias.  Skin:  Negative for rash.  Neurological:  Negative for dizziness, tingling, focal weakness, seizures, weakness and headaches.  Endo/Heme/Allergies:  Does not bruise/bleed easily.  Psychiatric/Behavioral:  Negative for depression and suicidal ideas. The patient does not have insomnia.       No Known Allergies   Past Medical History:  Diagnosis Date   Albuminuria 02/16/2014   Benign prostatic hyperplasia 10/11/2010   Biventricular cardiac pacemaker in situ    St Jude [employer] [place] RF [id number] CRT-P (serial number [id number]) implanted 12/15/2013   CAD (coronary artery disease)    Cardiac device in situ 03/16/2021   St Jude CRT-P implanted on 02/16/21 with St Jude RA, RV and LV leads implanted 12/15/2013   Carotid artery plaque, bilateral 02/27/2019   Chronic kidney disease (CKD) stage G3a/A1, moderately decreased glomerular filtration rate (GFR) between 45-59 mL/min/1.73 square meter and albuminuria creatinine ratio less than 30 mg/g (HCC) 09/17/2014   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 ([place]) 01/28/2014   Ex-smoker    GERD (gastroesophageal reflux disease) 12/10/2013   Glaucoma    H/O syncope 12/10/2013   History of Mohs surgery for squamous cell carcinoma in situ of skin 05/2016   Hypokalemia 02/16/2014   Long term current use of anticoagulant therapy    Mixed hyperlipidemia  NSVT (nonsustained ventricular tachycardia) ([place]) 01/08/2018   OSA on CPAP    Pacemaker pocket hematoma 02/2021   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 No pertinent surgical history.  Social History   Socioeconomic History   Marital status: Single    Spouse name: Not on file   Number of children: Not on file   Years of education: Not on file   Highest education level: Not on file  Occupational History   Not on file  Tobacco Use   Smoking status: Former    Packs/day: 3.00    Years: 21.00    Pack years: 63.00    Types: Cigarettes    Start date: 07/10/1968    Quit date: 07/09/1998    Years since quitting: 23.4    Passive exposure: Never   Smokeless tobacco: Never  Vaping Use   Vaping Use: Never used  Substance and Sexual Activity   Alcohol use: Not Currently    Alcohol/week: 1.0 standard drink    Types: 1 Glasses of wine per week    Comment: "social drinker wine consumption" 2-4 times a month   Drug use: Never   Sexual activity: Not on file  Other Topics Concern   Not on file  Social History Narrative   Not on file   Social Determinants of Health   Financial Resource Strain: Not on file  Food Insecurity: Not on file  Transportation Needs: Not on file  Physical Activity: Not on file  Stress: Not on file  Social Connections: Not on file  Intimate Partner Violence: Not on file    Family History  Problem Relation Age of Onset   Emphysema Father    Glaucoma Father    Bipolar disorder Brother    Skin cancer Brother    Skin cancer Brother      Current Outpatient Medications:    amLODipine (NORVASC) 10 MG tablet, Take 1 tablet by mouth daily., Disp: , Rfl:    atorvastatin (LIPITOR) 40 MG tablet, Take 1 tablet by mouth daily at 12 noon., Disp: , Rfl:    doxazosin (CARDURA) 4 MG tablet, Take 1 tablet by mouth daily at 12 noon., Disp: , Rfl:     glipiZIDE (GLUCOTROL) 5 MG tablet, Take 5 mg by mouth daily at 12 noon., Disp: , Rfl:    losartan (COZAAR) 25 MG tablet, Take 1 tablet by mouth daily., Disp: , Rfl:    warfarin (COUMADIN) 5 MG tablet, Take 1 tablet by mouth daily at 12 noon., Disp: , Rfl:    albuterol (VENTOLIN HFA) 108 (90 Base) MCG/ACT inhaler, Inhale 2 puffs into the lungs daily as needed for wheezing. (Patient not taking: Reported on 10/12/2021), Disp: , Rfl:    fluticasone (FLONASE) 50 MCG/ACT nasal spray, Place 2 sprays into the nose daily at 12 noon. (Patient not taking: Reported on 11/08/2021), Disp: , Rfl:    guaiFENesin (MUCINEX) 600 MG 12 hr tablet, Take 1 tablet by mouth every 12 (twelve) hours. (Patient not taking: Reported on 10/12/2021), Disp: , Rfl:    ibuprofen (ADVIL) 600 MG tablet, Take 600  mg by mouth every 6 (six) hours. (Patient not taking: Reported on 10/12/2021), Disp: , Rfl:    umeclidinium-vilanterol (ANORO ELLIPTA) 62.5-25 MCG/ACT AEPB, Inhale into the lungs. (Patient not taking: Reported on 11/08/2021), Disp: , Rfl:   Physical exam:  Vitals:   11/29/21 1307  BP: 130/62  Pulse: (!) 56  Resp: 18  Temp: 97.6 F (36.4 C)  SpO2: 100%  Weight: 250 lb 1.6 oz (113.4 kg)   Physical Exam Constitutional:      General: He is not in acute distress. Cardiovascular:     Rate and Rhythm: Normal rate and regular rhythm.     Heart sounds: Normal heart sounds.  Pulmonary:     Effort: Pulmonary effort is normal.     Breath sounds: Normal breath sounds.  Abdominal:     General: Bowel sounds are normal.     Palpations: Abdomen is soft.  Skin:    General: Skin is warm and dry.  Neurological:     Mental Status: He is alert and oriented to person, place, and time.        Latest Ref Rng & Units 10/12/2021    2:15 PM  CMP  Glucose 70 - 99 mg/dL 105    BUN 8 - 23 mg/dL 28    Creatinine 0.61 - 1.24 mg/dL 1.11    Sodium 135 - 145 mmol/L 135    Potassium 3.5 - 5.1 mmol/L 3.7    Chloride 98 - 111 mmol/L 100    CO2 22  - 32 mmol/L 27    Calcium 8.9 - 10.3 mg/dL 9.3    Total Protein 6.5 - 8.1 g/dL 7.9    Total Bilirubin 0.3 - 1.2 mg/dL 1.3    Alkaline Phos 38 - 126 U/L 57    AST 15 - 41 U/L 28    ALT 0 - 44 U/L 29        Latest Ref Rng & Units 11/18/2021    7:47 AM  CBC  WBC 4.0 - 10.5 K/uL 7.5    Hemoglobin 13.0 - 17.0 g/dL 15.3    Hematocrit 39.0 - 52.0 % 45.2    Platelets 150 - 400 K/uL 163      No images are attached to the encounter.  CT BONE MARROW BIOPSY & ASPIRATION  Result Date: 11/18/2021 INDICATION: Monoclonal gammopathy of unknown significance. EXAM: CT GUIDED BONE MARROW ASPIRATES AND BIOPSY Physician: Stephan Minister. Anselm Pancoast, MD MEDICATIONS: None. ANESTHESIA/SEDATION: Moderate (conscious) sedation was employed during this procedure. A total of Versed 1.16m and fentanyl 50 mcg was administered intravenously at the order of the provider performing the procedure. Total intra-service moderate sedation time: 10 minutes. Patient's level of consciousness and vital signs were monitored continuously by radiology nurse throughout the procedure under the supervision of the provider performing the procedure. COMPLICATIONS: None immediate. PROCEDURE: The procedure was explained to the patient. The risks and benefits of the procedure were discussed and the patient's questions were addressed. Informed consent was obtained from the patient. The patient was placed prone on CT table. Images of the pelvis were obtained. The right side of back was prepped and draped in sterile fashion. The skin and right posterior ilium were anesthetized with 1% lidocaine. 11 gauge bone needle was directed into the right ilium with CT guidance. Two aspirates and two core biopsy were obtained. Bandage placed over the puncture site. FINDINGS: Biopsy needle directed in the posterior right ilium. IMPRESSION: CT guided bone marrow aspiration and core biopsy. Electronically Signed  By: Markus Daft M.D.   On: 11/18/2021 15:13     Assessment and  plan- Patient is a 76 y.o. male here to discuss results of bone marrow biopsy  Patient CBC is normal with a hemoglobin of 15.3.  Serum calcium normal at 9.3 and creatinine of 1.1.  Total protein normal at 7.9.  Myeloma panel showed a small amount of 0.4 IgA kappa light chain protein.  Serum kappa was elevated at 392 with a ratio of 25.  I therefore obtained a bone marrow biopsy which shows 10% plasma cells.  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  He did have a bone survey as well which did not show any evidence of lytic lesions.  I am inclined to monitor her smoldering multiple myeloma conservatively and he does not require any treatment at this time.  Again discussed differences between MGUS, smoldering multiple myeloma and overt multiple myeloma.  Discussed that he does not have any crab criteria which would be a myeloma defining event.  We will repeat CBC CMP myeloma panel and serum free light chains in 3 in 6 months and I will see him back in 6 months   Visit Diagnosis 1. MGUS (monoclonal gammopathy of unknown significance)      Dr. Randa Evens, MD, MPH [facility] at [facility] [phone] 12/05/2021 8:24 AM

## 2022-02-20 DIAGNOSIS — D472 Monoclonal gammopathy: Secondary | ICD-10-CM | POA: Insufficient documentation

## 2022-02-20 DIAGNOSIS — I5020 Unspecified systolic (congestive) heart failure: Secondary | ICD-10-CM | POA: Insufficient documentation

## 2022-03-01 ENCOUNTER — Inpatient Hospital Stay: Payer: Medicare Other | Attending: Oncology

## 2022-03-01 DIAGNOSIS — D472 Monoclonal gammopathy: Secondary | ICD-10-CM | POA: Diagnosis present

## 2022-03-01 LAB — COMPREHENSIVE METABOLIC PANEL
ALT: 26 U/L (ref 0–44)
AST: 27 U/L (ref 15–41)
Albumin: 4.3 g/dL (ref 3.5–5.0)
Alkaline Phosphatase: 57 U/L (ref 38–126)
Anion gap: 12 (ref 5–15)
BUN: 25 mg/dL — ABNORMAL HIGH (ref 8–23)
CO2: 26 mmol/L (ref 22–32)
Calcium: 9.4 mg/dL (ref 8.9–10.3)
Chloride: 98 mmol/L (ref 98–111)
Creatinine, Ser: 1.3 mg/dL — ABNORMAL HIGH (ref 0.61–1.24)
GFR, Estimated: 56 mL/min — ABNORMAL LOW (ref >60)
Glucose, Bld: 126 mg/dL — ABNORMAL HIGH (ref 70–99)
Potassium: 3.9 mmol/L (ref 3.5–5.1)
Sodium: 136 mmol/L (ref 135–145)
Total Bilirubin: 1.2 mg/dL (ref 0.3–1.2)
Total Protein: 7.6 g/dL (ref 6.5–8.1)

## 2022-03-01 LAB — CBC WITH DIFFERENTIAL/PLATELET
Abs Immature Granulocytes: 0.03 10*3/uL (ref 0.00–0.07)
Basophils Absolute: 0 10*3/uL (ref 0.0–0.1)
Basophils Relative: 0 %
Eosinophils Absolute: 0.2 10*3/uL (ref 0.0–0.5)
Eosinophils Relative: 3 %
HCT: 44.1 % (ref 39.0–52.0)
Hemoglobin: 15.2 g/dL (ref 13.0–17.0)
Immature Granulocytes: 0 %
Lymphocytes Relative: 15 %
Lymphs Abs: 1.1 10*3/uL (ref 0.7–4.0)
MCH: 29.2 pg (ref 26.0–34.0)
MCHC: 34.5 g/dL (ref 30.0–36.0)
MCV: 84.6 fL (ref 80.0–100.0)
Monocytes Absolute: 0.6 10*3/uL (ref 0.1–1.0)
Monocytes Relative: 8 %
Neutro Abs: 5.6 10*3/uL (ref 1.7–7.7)
Neutrophils Relative %: 74 %
Platelets: 160 10*3/uL (ref 150–400)
RBC: 5.21 MIL/uL (ref 4.22–5.81)
RDW: 13.5 % (ref 11.5–15.5)
WBC: 7.6 10*3/uL (ref 4.0–10.5)
nRBC: 0 % (ref 0.0–0.2)

## 2022-03-02 LAB — KAPPA/LAMBDA LIGHT CHAINS
Kappa free light chain: 341.4 mg/L — ABNORMAL HIGH (ref 3.3–19.4)
Kappa, lambda light chain ratio: 24.92 — ABNORMAL HIGH (ref 0.26–1.65)
Lambda free light chains: 13.7 mg/L (ref 5.7–26.3)

## 2022-03-06 LAB — MULTIPLE MYELOMA PANEL, SERUM
Albumin SerPl Elph-Mcnc: 4 g/dL (ref 2.9–4.4)
Albumin/Glob SerPl: 1.4 (ref 0.7–1.7)
Alpha 1: 0.2 g/dL (ref 0.0–0.4)
Alpha2 Glob SerPl Elph-Mcnc: 0.8 g/dL (ref 0.4–1.0)
B-Globulin SerPl Elph-Mcnc: 0.9 g/dL (ref 0.7–1.3)
Gamma Glob SerPl Elph-Mcnc: 1.1 g/dL (ref 0.4–1.8)
Globulin, Total: 3 g/dL (ref 2.2–3.9)
IgA: 599 mg/dL — ABNORMAL HIGH (ref 61–437)
IgG (Immunoglobin G), Serum: 958 mg/dL (ref 603–1613)
IgM (Immunoglobulin M), Srm: 33 mg/dL (ref 15–143)
M Protein SerPl Elph-Mcnc: 0.5 g/dL — ABNORMAL HIGH
Total Protein ELP: 7 g/dL (ref 6.0–8.5)

## 2022-04-07 DIAGNOSIS — Z85828 Personal history of other malignant neoplasm of skin: Secondary | ICD-10-CM | POA: Insufficient documentation

## 2022-05-31 ENCOUNTER — Other Ambulatory Visit: Payer: Self-pay

## 2022-05-31 DIAGNOSIS — D472 Monoclonal gammopathy: Secondary | ICD-10-CM

## 2022-06-05 ENCOUNTER — Inpatient Hospital Stay: Payer: Medicare Other | Attending: Oncology | Admitting: Oncology

## 2022-06-05 ENCOUNTER — Inpatient Hospital Stay: Payer: Medicare Other

## 2022-06-05 ENCOUNTER — Encounter: Payer: Self-pay | Admitting: Oncology

## 2022-06-05 VITALS — BP 131/59 | HR 59 | Resp 18 | Wt 236.1 lb

## 2022-06-05 DIAGNOSIS — D472 Monoclonal gammopathy: Secondary | ICD-10-CM | POA: Diagnosis present

## 2022-06-05 LAB — COMPREHENSIVE METABOLIC PANEL
ALT: 32 U/L (ref 0–44)
AST: 31 U/L (ref 15–41)
Albumin: 4.1 g/dL (ref 3.5–5.0)
Alkaline Phosphatase: 65 U/L (ref 38–126)
Anion gap: 12 (ref 5–15)
BUN: 24 mg/dL — ABNORMAL HIGH (ref 8–23)
CO2: 25 mmol/L (ref 22–32)
Calcium: 9.5 mg/dL (ref 8.9–10.3)
Chloride: 98 mmol/L (ref 98–111)
Creatinine, Ser: 1.18 mg/dL (ref 0.61–1.24)
GFR, Estimated: >60 mL/min (ref >60)
Glucose, Bld: 268 mg/dL — ABNORMAL HIGH (ref 70–99)
Potassium: 4 mmol/L (ref 3.5–5.1)
Sodium: 135 mmol/L (ref 135–145)
Total Bilirubin: 0.9 mg/dL (ref 0.3–1.2)
Total Protein: 8 g/dL (ref 6.5–8.1)

## 2022-06-05 LAB — CBC WITH DIFFERENTIAL/PLATELET
Abs Immature Granulocytes: 0.06 10*3/uL (ref 0.00–0.07)
Basophils Absolute: 0 10*3/uL (ref 0.0–0.1)
Basophils Relative: 0 %
Eosinophils Absolute: 0.3 10*3/uL (ref 0.0–0.5)
Eosinophils Relative: 3 %
HCT: 44.6 % (ref 39.0–52.0)
Hemoglobin: 15.4 g/dL (ref 13.0–17.0)
Immature Granulocytes: 1 %
Lymphocytes Relative: 12 %
Lymphs Abs: 1 10*3/uL (ref 0.7–4.0)
MCH: 29.2 pg (ref 26.0–34.0)
MCHC: 34.5 g/dL (ref 30.0–36.0)
MCV: 84.5 fL (ref 80.0–100.0)
Monocytes Absolute: 0.5 10*3/uL (ref 0.1–1.0)
Monocytes Relative: 6 %
Neutro Abs: 6.7 10*3/uL (ref 1.7–7.7)
Neutrophils Relative %: 78 %
Platelets: 187 10*3/uL (ref 150–400)
RBC: 5.28 MIL/uL (ref 4.22–5.81)
RDW: 14 % (ref 11.5–15.5)
WBC: 8.6 10*3/uL (ref 4.0–10.5)
nRBC: 0 % (ref 0.0–0.2)

## 2022-06-05 NOTE — Progress Notes (Signed)
Hematology/Oncology Consult note [facility]  [phone] [phone]  Patient Care Team: Oleh Genin, MD as PCP - General (Internal Medicine) Sindy Guadeloupe, MD as Consulting Physician (Hematology)   Name of the patient: Aaron Carr  [id number]  11-22-1945   Date of visit: 06/05/22  Diagnosis- IgA kappa MGUS   Chief complaint/ Reason for visit- routine f/u of MGUS  Heme/Onc history: Patient is a 76 year old male with a past medical history significant for CKD, CAD, BPH, hyperlipidemia who has been referred for MGUS.  Blood work from 09/07/2021 showed a normal CBC with an H&H of 15.4/45.8.  Iron studies showed a normal TIBC SPEP showed a small M spike of 0.38 g B12 and folate were normal.  Immunofixation showed IgA kappa.  BMP showed mildly elevated serum calcium of 10.3 with a creatinine of 1.2   Results of blood work from 4 11/27/2018 showed a normal CBC with an H&H of 15.4/44.4.  Serum creatinine is normal at 1.1 with a calcium of 9.3.  Total protein normal at 7.9.  Myeloma panel shows a small IgA kappa M protein of 0.4 g.  However kappa light chain ratio is elevated at 25 with an elevated serum free kappa of 392. Results of bone marrow biopsy showed hypercellular marrow involved with plasma cell neoplasm 10%.  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  Denies any specific complaints today.  He denies any new aches or pains anywhere.  Appetite is good although he has lost about 14 pounds in the last 6 months.  ECOG PS- 1 Pain scale- 0   Review of systems- Review of Systems  Constitutional:  Positive for malaise/fatigue. Negative for chills, fever and weight loss.  HENT:  Negative for congestion, ear discharge and nosebleeds.   Eyes:  Negative for blurred vision.  Respiratory:  Negative for cough, hemoptysis, sputum production, shortness of breath and  wheezing.   Cardiovascular:  Negative for chest pain, palpitations, orthopnea and claudication.  Gastrointestinal:  Negative for abdominal pain, blood in stool, constipation, diarrhea, heartburn, melena, nausea and vomiting.  Genitourinary:  Negative for dysuria, flank pain, frequency, hematuria and urgency.  Musculoskeletal:  Negative for back pain, joint pain and myalgias.  Skin:  Negative for rash.  Neurological:  Negative for dizziness, tingling, focal weakness, seizures, weakness and headaches.  Endo/Heme/Allergies:  Does not bruise/bleed easily.  Psychiatric/Behavioral:  Negative for depression and suicidal ideas. The patient does not have insomnia.       No Known Allergies   Past Medical History:  Diagnosis Date   Albuminuria 02/16/2014   Benign prostatic hyperplasia 10/11/2010   Biventricular cardiac pacemaker in situ    St Jude Allure [place] RF [id number] CRT-P (serial number [id number]) implanted 12/15/2013   CAD (coronary artery disease)    Cardiac device in situ 03/16/2021   St Jude CRT-P implanted on 02/16/21 with St Jude RA, RV and LV leads implanted 12/15/2013   Carotid artery plaque, bilateral 02/27/2019   Chronic kidney disease (CKD) stage G3a/A1, moderately decreased glomerular filtration rate (GFR) between 45-59 mL/min/1.73 square meter and albuminuria creatinine ratio less than 30 mg/g (HCC) 09/17/2014   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 ([place]) 01/28/2014   Ex-smoker    GERD (gastroesophageal reflux disease) 12/10/2013   Glaucoma    H/O syncope 12/10/2013   History of Mohs surgery for squamous cell carcinoma in situ of skin 05/2016  Hypokalemia 02/16/2014   Long term current use of anticoagulant therapy    Mixed hyperlipidemia    NSVT (nonsustained ventricular tachycardia) ([place]) 01/08/2018   OSA on CPAP    Pacemaker pocket hematoma 02/2021   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  Social History   Socioeconomic History   Marital status: Single    Spouse name: Not on file   Number of children: Not on file   Years of education: Not on file   Highest education level: Not on file  Occupational History   Not on file  Tobacco Use   Smoking status: Former    Packs/day: 3.00    Years: 21.00    Total pack years: 63.00    Types: Cigarettes    Start date: 07/10/1968    Quit date: 07/09/1998    Years since quitting: 23.9    Passive exposure: Never   Smokeless tobacco: Never  Vaping Use   Vaping Use: Never used  Substance and Sexual Activity   Alcohol use: Not Currently    Alcohol/week: 1.0 standard drink of alcohol    Types: 1 Glasses of wine per week    Comment: "social drinker wine consumption" 2-4 times a month   Drug use: Never   Sexual activity: Not on file  Other Topics Concern   Not on file  Social History Narrative   Not on file   Social Determinants of Health   Financial Resource Strain: Not on file  Food Insecurity: Not on file  Transportation Needs: Not on file  Physical Activity: Not on file  Stress: Not on file  Social Connections: Not on file  Intimate Partner Violence: Not on file    Family History  Problem Relation Age of Onset   Emphysema Father    Glaucoma Father    Bipolar disorder Brother    Skin cancer Brother    Skin cancer Brother      Current Outpatient Medications:    albuterol (VENTOLIN HFA) 108 (90 Base) MCG/ACT inhaler, Inhale 2 puffs into the lungs daily as needed for wheezing. (Patient not taking: Reported on 10/12/2021), Disp: , Rfl:    amLODipine (NORVASC) 10 MG  tablet, Take 1 tablet by mouth daily., Disp: , Rfl:    atorvastatin (LIPITOR) 40 MG tablet, Take 1 tablet by mouth daily at 12 noon., Disp: , Rfl:    doxazosin (CARDURA) 4 MG tablet, Take 1 tablet by mouth daily at 12 noon., Disp: , Rfl:    fluticasone (FLONASE) 50 MCG/ACT nasal spray, Place 2 sprays into the nose daily at 12 noon. (Patient not taking: Reported on 11/08/2021), Disp: , Rfl:    glipiZIDE (GLUCOTROL) 5 MG tablet, Take 5 mg by mouth daily at 12 noon., Disp: , Rfl:    guaiFENesin (MUCINEX) 600 MG 12 hr tablet, Take 1 tablet by mouth every 12 (twelve) hours. (Patient not taking: Reported on 10/12/2021), Disp: , Rfl:    ibuprofen (ADVIL) 600 MG tablet, Take 600 mg by mouth every 6 (six) hours. (Patient not taking: Reported on 10/12/2021), Disp: ,  Rfl:    losartan (COZAAR) 25 MG tablet, Take 1 tablet by mouth daily., Disp: , Rfl:    umeclidinium-vilanterol (ANORO ELLIPTA) 62.5-25 MCG/ACT AEPB, Inhale into the lungs. (Patient not taking: Reported on 11/08/2021), Disp: , Rfl:    warfarin (COUMADIN) 5 MG tablet, Take 1 tablet by mouth daily at 12 noon., Disp: , Rfl:   Physical exam: There were no vitals filed for this visit. Physical Exam Cardiovascular:     Rate and Rhythm: Normal rate and regular rhythm.     Heart sounds: Normal heart sounds.  Pulmonary:     Effort: Pulmonary effort is normal.     Breath sounds: Normal breath sounds.  Abdominal:     General: Bowel sounds are normal.     Palpations: Abdomen is soft.  Skin:    General: Skin is warm and dry.  Neurological:     Mental Status: He is alert and oriented to person, place, and time.         Latest Ref Rng & Units 03/01/2022    1:52 PM  CMP  Glucose 70 - 99 mg/dL 126   BUN 8 - 23 mg/dL 25   Creatinine 0.61 - 1.24 mg/dL 1.30   Sodium 135 - 145 mmol/L 136   Potassium 3.5 - 5.1 mmol/L 3.9   Chloride 98 - 111 mmol/L 98   CO2 22 - 32 mmol/L 26   Calcium 8.9 - 10.3 mg/dL 9.4   Total Protein 6.5 - 8.1 g/dL 7.6   Total  Bilirubin 0.3 - 1.2 mg/dL 1.2   Alkaline Phos 38 - 126 U/L 57   AST 15 - 41 U/L 27   ALT 0 - 44 U/L 26       Latest Ref Rng & Units 03/01/2022    1:52 PM  CBC  WBC 4.0 - 10.5 K/uL 7.6   Hemoglobin 13.0 - 17.0 g/dL 15.2   Hematocrit 39.0 - 52.0 % 44.1   Platelets 150 - 400 K/uL 160      Assessment and plan- Patient is a 76 y.o. male here for routine f//u of IgA kappa MGUS here for follow-up.  He is here for  CBC is presently normal with a hemoglobin of 15.4.  Serum creatinine is normal.  Total protein and calcium levels are also normal.  Serum free light chain ratio is stable between 17-25 with no clear rising trend.  Myeloma panel shows 0.4 g of IgG monoclonal kappa light chain which is also remained stable.  Overall there is no overt indication of progression to multiple myeloma.  I will repeat CBC with differential CMP myeloma panel and serum free light chains in 4 months in 8 months and I will see him back in 8 months   Visit Diagnosis 1. MGUS (monoclonal gammopathy of unknown significance)      Dr. Randa Evens, MD, MPH [facility] [phone] 06/05/2022 1:06 PM

## 2022-06-06 LAB — KAPPA/LAMBDA LIGHT CHAINS
Kappa free light chain: 304.3 mg/L — ABNORMAL HIGH (ref 3.3–19.4)
Kappa, lambda light chain ratio: 17.8 — ABNORMAL HIGH (ref 0.26–1.65)
Lambda free light chains: 17.1 mg/L (ref 5.7–26.3)

## 2022-06-07 LAB — MULTIPLE MYELOMA PANEL, SERUM
Albumin SerPl Elph-Mcnc: 3.8 g/dL (ref 2.9–4.4)
Albumin/Glob SerPl: 1.2 (ref 0.7–1.7)
Alpha 1: 0.2 g/dL (ref 0.0–0.4)
Alpha2 Glob SerPl Elph-Mcnc: 0.9 g/dL (ref 0.4–1.0)
B-Globulin SerPl Elph-Mcnc: 0.9 g/dL (ref 0.7–1.3)
Gamma Glob SerPl Elph-Mcnc: 1.2 g/dL (ref 0.4–1.8)
Globulin, Total: 3.3 g/dL (ref 2.2–3.9)
IgA: 503 mg/dL — ABNORMAL HIGH (ref 61–437)
IgG (Immunoglobin G), Serum: 926 mg/dL (ref 603–1613)
IgM (Immunoglobulin M), Srm: 35 mg/dL (ref 15–143)
M Protein SerPl Elph-Mcnc: 0.4 g/dL — ABNORMAL HIGH
Total Protein ELP: 7.1 g/dL (ref 6.0–8.5)

## 2022-08-21 DIAGNOSIS — I77810 Thoracic aortic ectasia: Secondary | ICD-10-CM | POA: Insufficient documentation

## 2022-09-25 DIAGNOSIS — E1142 Type 2 diabetes mellitus with diabetic polyneuropathy: Secondary | ICD-10-CM | POA: Insufficient documentation

## 2022-09-25 DIAGNOSIS — Z8616 Personal history of COVID-19: Secondary | ICD-10-CM | POA: Insufficient documentation

## 2022-09-25 DIAGNOSIS — D490 Neoplasm of unspecified behavior of digestive system: Secondary | ICD-10-CM | POA: Insufficient documentation

## 2022-10-04 ENCOUNTER — Inpatient Hospital Stay: Payer: Medicare Other | Attending: Oncology

## 2022-10-04 DIAGNOSIS — D472 Monoclonal gammopathy: Secondary | ICD-10-CM | POA: Diagnosis present

## 2022-10-04 LAB — COMPREHENSIVE METABOLIC PANEL
ALT: 27 U/L (ref 0–44)
AST: 29 U/L (ref 15–41)
Albumin: 4.2 g/dL (ref 3.5–5.0)
Alkaline Phosphatase: 65 U/L (ref 38–126)
Anion gap: 6 (ref 5–15)
BUN: 20 mg/dL (ref 8–23)
CO2: 27 mmol/L (ref 22–32)
Calcium: 9.1 mg/dL (ref 8.9–10.3)
Chloride: 102 mmol/L (ref 98–111)
Creatinine, Ser: 1.1 mg/dL (ref 0.61–1.24)
GFR, Estimated: >60 mL/min (ref >60)
Glucose, Bld: 125 mg/dL — ABNORMAL HIGH (ref 70–99)
Potassium: 3.6 mmol/L (ref 3.5–5.1)
Sodium: 135 mmol/L (ref 135–145)
Total Bilirubin: 0.9 mg/dL (ref 0.3–1.2)
Total Protein: 7.6 g/dL (ref 6.5–8.1)

## 2022-10-04 LAB — CBC WITH DIFFERENTIAL/PLATELET
Abs Immature Granulocytes: 0.03 10*3/uL (ref 0.00–0.07)
Basophils Absolute: 0 10*3/uL (ref 0.0–0.1)
Basophils Relative: 0 %
Eosinophils Absolute: 0.4 10*3/uL (ref 0.0–0.5)
Eosinophils Relative: 4 %
HCT: 42.2 % (ref 39.0–52.0)
Hemoglobin: 14.1 g/dL (ref 13.0–17.0)
Immature Granulocytes: 0 %
Lymphocytes Relative: 14 %
Lymphs Abs: 1.2 10*3/uL (ref 0.7–4.0)
MCH: 29.1 pg (ref 26.0–34.0)
MCHC: 33.4 g/dL (ref 30.0–36.0)
MCV: 87.2 fL (ref 80.0–100.0)
Monocytes Absolute: 0.6 10*3/uL (ref 0.1–1.0)
Monocytes Relative: 8 %
Neutro Abs: 6.1 10*3/uL (ref 1.7–7.7)
Neutrophils Relative %: 74 %
Platelets: 185 10*3/uL (ref 150–400)
RBC: 4.84 MIL/uL (ref 4.22–5.81)
RDW: 13.9 % (ref 11.5–15.5)
WBC: 8.3 10*3/uL (ref 4.0–10.5)
nRBC: 0 % (ref 0.0–0.2)

## 2022-10-05 LAB — KAPPA/LAMBDA LIGHT CHAINS
Kappa free light chain: 375.4 mg/L — ABNORMAL HIGH (ref 3.3–19.4)
Kappa, lambda light chain ratio: 20.97 — ABNORMAL HIGH (ref 0.26–1.65)
Lambda free light chains: 17.9 mg/L (ref 5.7–26.3)

## 2022-10-09 LAB — MULTIPLE MYELOMA PANEL, SERUM
Albumin SerPl Elph-Mcnc: 3.9 g/dL (ref 2.9–4.4)
Albumin/Glob SerPl: 1.4 (ref 0.7–1.7)
Alpha 1: 0.2 g/dL (ref 0.0–0.4)
Alpha2 Glob SerPl Elph-Mcnc: 0.8 g/dL (ref 0.4–1.0)
B-Globulin SerPl Elph-Mcnc: 0.9 g/dL (ref 0.7–1.3)
Gamma Glob SerPl Elph-Mcnc: 1.1 g/dL (ref 0.4–1.8)
Globulin, Total: 3 g/dL (ref 2.2–3.9)
IgA: 538 mg/dL — ABNORMAL HIGH (ref 61–437)
IgG (Immunoglobin G), Serum: 927 mg/dL (ref 603–1613)
IgM (Immunoglobulin M), Srm: 32 mg/dL (ref 15–143)
M Protein SerPl Elph-Mcnc: 0.4 g/dL — ABNORMAL HIGH
Total Protein ELP: 6.9 g/dL (ref 6.0–8.5)

## 2023-01-14 DIAGNOSIS — C44222 Squamous cell carcinoma of skin of right ear and external auricular canal: Secondary | ICD-10-CM | POA: Insufficient documentation

## 2023-02-13 ENCOUNTER — Inpatient Hospital Stay: Payer: Medicare Other | Admitting: Oncology

## 2023-02-13 ENCOUNTER — Inpatient Hospital Stay: Payer: Medicare Other

## 2023-02-20 ENCOUNTER — Inpatient Hospital Stay: Payer: Medicare Other | Attending: Oncology

## 2023-02-20 ENCOUNTER — Encounter: Payer: Self-pay | Admitting: Oncology

## 2023-02-20 ENCOUNTER — Inpatient Hospital Stay (HOSPITAL_BASED_OUTPATIENT_CLINIC_OR_DEPARTMENT_OTHER): Payer: Medicare Other | Admitting: Oncology

## 2023-02-20 VITALS — BP 123/76 | HR 63 | Temp 97.5°F | Resp 18 | Ht 73.0 in | Wt 218.6 lb

## 2023-02-20 DIAGNOSIS — D472 Monoclonal gammopathy: Secondary | ICD-10-CM | POA: Diagnosis present

## 2023-02-20 LAB — CBC WITH DIFFERENTIAL/PLATELET
Abs Immature Granulocytes: 0.03 10*3/uL (ref 0.00–0.07)
Basophils Absolute: 0 10*3/uL (ref 0.0–0.1)
Basophils Relative: 0 %
Eosinophils Absolute: 0.3 10*3/uL (ref 0.0–0.5)
Eosinophils Relative: 3 %
HCT: 42.2 % (ref 39.0–52.0)
Hemoglobin: 14.4 g/dL (ref 13.0–17.0)
Immature Granulocytes: 0 %
Lymphocytes Relative: 12 %
Lymphs Abs: 1 10*3/uL (ref 0.7–4.0)
MCH: 29.3 pg (ref 26.0–34.0)
MCHC: 34.1 g/dL (ref 30.0–36.0)
MCV: 85.9 fL (ref 80.0–100.0)
Monocytes Absolute: 0.7 10*3/uL (ref 0.1–1.0)
Monocytes Relative: 8 %
Neutro Abs: 6 10*3/uL (ref 1.7–7.7)
Neutrophils Relative %: 77 %
Platelets: 187 10*3/uL (ref 150–400)
RBC: 4.91 MIL/uL (ref 4.22–5.81)
RDW: 14 % (ref 11.5–15.5)
WBC: 8 10*3/uL (ref 4.0–10.5)
nRBC: 0 % (ref 0.0–0.2)

## 2023-02-20 LAB — COMPREHENSIVE METABOLIC PANEL
ALT: 25 U/L (ref 0–44)
AST: 26 U/L (ref 15–41)
Albumin: 4.3 g/dL (ref 3.5–5.0)
Alkaline Phosphatase: 64 U/L (ref 38–126)
Anion gap: 8 (ref 5–15)
BUN: 20 mg/dL (ref 8–23)
CO2: 25 mmol/L (ref 22–32)
Calcium: 9.1 mg/dL (ref 8.9–10.3)
Chloride: 101 mmol/L (ref 98–111)
Creatinine, Ser: 1.07 mg/dL (ref 0.61–1.24)
GFR, Estimated: >60 mL/min (ref >60)
Glucose, Bld: 106 mg/dL — ABNORMAL HIGH (ref 70–99)
Potassium: 3.9 mmol/L (ref 3.5–5.1)
Sodium: 134 mmol/L — ABNORMAL LOW (ref 135–145)
Total Bilirubin: 1.2 mg/dL (ref 0.3–1.2)
Total Protein: 7.7 g/dL (ref 6.5–8.1)

## 2023-02-20 NOTE — Progress Notes (Signed)
Hematology/Oncology Consult note [facility]  Telephone:([phone] [phone]  Patient Care Team: Karl Bales, MD as PCP - General (Internal Medicine) Creig Hines, MD as Consulting Physician (Hematology)   Name of the patient: Aaron Carr  [id number]  1945/12/22   Date of visit: 02/20/23  Diagnosis-IgA kappa MGUS  Chief complaint/ Reason for visit-routine follow-up of MGUS  Heme/Onc history: Patient is a 77 year old male with a past medical history significant for CKD, CAD, BPH, hyperlipidemia who has been referred for MGUS.  Blood work from 09/07/2021 showed a normal CBC with an H&H of 15.4/45.8.  Iron studies showed a normal TIBC SPEP showed a small M spike of 0.38 g B12 and folate were normal.  Immunofixation showed IgA kappa.  BMP showed mildly elevated serum calcium of 10.3 with a creatinine of 1.2   Results of blood work from 4 11/27/2018 showed a normal CBC with an H&H of 15.4/44.4.  Serum creatinine is normal at 1.1 with a calcium of 9.3.  Total protein normal at 7.9.  Myeloma panel shows a small IgA kappa M protein of 0.4 g.  However kappa light chain ratio is elevated at 25 with an elevated serum free kappa of 392. Results of bone marrow biopsy showed hypercellular marrow involved with plasma cell neoplasm 10%.  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  Denies any changes in his appetite or weight.  Denies any new aches and pains anywhere.  He fend for a 3-week road trip by himself across multiple states  ECOG PS- 1 Pain scale- 0   Review of systems- Review of Systems  Constitutional:  Negative for chills, fever, malaise/fatigue and weight loss.  HENT:  Negative for congestion, ear discharge and nosebleeds.   Eyes:  Negative for blurred vision.  Respiratory:  Negative for cough, hemoptysis, sputum production, shortness of breath and wheezing.    Cardiovascular:  Negative for chest pain, palpitations, orthopnea and claudication.  Gastrointestinal:  Negative for abdominal pain, blood in stool, constipation, diarrhea, heartburn, melena, nausea and vomiting.  Genitourinary:  Negative for dysuria, flank pain, frequency, hematuria and urgency.  Musculoskeletal:  Negative for back pain, joint pain and myalgias.  Skin:  Negative for rash.  Neurological:  Negative for dizziness, tingling, focal weakness, seizures, weakness and headaches.  Endo/Heme/Allergies:  Does not bruise/bleed easily.  Psychiatric/Behavioral:  Negative for depression and suicidal ideas. The patient does not have insomnia.       No Known Allergies   Past Medical History:  Diagnosis Date   Albuminuria 02/16/2014   Benign prostatic hyperplasia 10/11/2010   Biventricular cardiac pacemaker in situ    St Jude Allure [place] RF [id number] CRT-P (serial number [id number]) implanted 12/15/2013   CAD (coronary artery disease)    Cardiac device in situ 03/16/2021   St Jude CRT-P implanted on 02/16/21 with St Jude RA, RV and LV leads implanted 12/15/2013   Carotid artery plaque, bilateral 02/27/2019   Chronic kidney disease (CKD) stage G3a/A1, moderately decreased glomerular filtration rate (GFR) between 45-59 mL/min/1.73 square meter and albuminuria creatinine ratio less than 30 mg/g (HCC) 09/17/2014   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   Ex-smoker    GERD (gastroesophageal reflux disease) 12/10/2013   Glaucoma    H/O syncope 12/10/2013   History of Mohs surgery for squamous cell carcinoma in situ of skin 05/2016   Hypokalemia 02/16/2014  Long term current use of anticoagulant therapy    Mixed hyperlipidemia    NSVT (nonsustained ventricular tachycardia) (HCC) 01/08/2018   OSA on CPAP    Pacemaker pocket hematoma 02/2021   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 No pertinent surgical history.  Social History   Socioeconomic History   Marital status: Single    Spouse name: Not on file   Number of children: Not on file   Years of education: Not on file   Highest education level: Not on file  Occupational History   Not on file  Tobacco Use   Smoking status: Former    Current packs/Aaron: 0.00    Average packs/Aaron: 3.0 packs/Aaron for 30.0 years (90.0 ttl pk-yrs)    Types: Cigarettes    Start date: 07/10/1968    Quit date: 07/09/1998    Years since quitting: 24.6    Passive exposure: Never   Smokeless tobacco: Never  Vaping Use   Vaping status: Never Used  Substance and Sexual Activity   Alcohol use: Not Currently    Alcohol/week: 1.0 standard drink of alcohol    Types: 1 Glasses of wine per week    Comment: "social drinker wine consumption" 2-4 times a month   Drug use: Never   Sexual activity: Not on file  Other Topics Concern   Not on file  Social History Narrative   Not on file   Social Determinants of Health   Financial Resource Strain: Medium Risk (01/08/2023)   Received from [facility]   Overall Financial Resource Strain (CARDIA)    Difficulty of Paying Living Expenses: Somewhat hard  Food Insecurity: No Food Insecurity (01/08/2023)   Received from [facility]   Hunger Vital Sign    Worried About Running Out of Food in the Last Year: Never true    Ran Out of Food in the Last Year: Never true  Transportation Needs: No Transportation Needs (01/08/2023)   Received [facility]    In the past 12 months, has lack of  transportation kept you from medical appointments or from getting medications?: No    Lack of Transportation (Non-Medical): No  Physical Activity: Sufficiently Active (08/03/2020)   [facility], [facility]   Exercise Vital Sign    Days of Exercise per Week: 7 days    Minutes of Exercise per Session: 30 min  Stress: No Stress Concern Present (08/03/2020)   Received from [facility], [facility]   [employer] of Occupational Health - Occupational Stress Questionnaire    Feeling of Stress : Not at all  Social Connections: Moderately Integrated (08/03/2020)   Received from [facility], [facility]   Social Connection and Isolation Panel [NHANES]    Frequency of Communication with Friends and Family: More than three times a week    Frequency of Social Gatherings with Friends and Family: More than three times a week    Attends Religious Services: More than  4 times per year    Active Member of Clubs or Organizations: Yes    Attends Engineer, structural: More than 4 times per year    Marital Status: Divorced  Catering manager Violence: Not on file    Family History  Problem Relation Age of Onset   Emphysema Father    Glaucoma Father    Bipolar disorder Brother    Skin cancer Brother    Skin cancer Brother      Current Outpatient Medications:    amLODipine (NORVASC) 10 MG tablet, Take 1 tablet by mouth daily., Disp: , Rfl:    atorvastatin (LIPITOR) 40 MG tablet, Take 1 tablet by mouth daily at 12 noon., Disp: , Rfl:    doxazosin (CARDURA) 4 MG tablet, Take 1 tablet by mouth daily at 12 noon., Disp: , Rfl:    losartan (COZAAR) 25 MG tablet, Take 1 tablet by mouth daily., Disp: , Rfl:    metformin (FORTAMET) 500 MG (OSM) 24 hr tablet, Take 500 mg by mouth daily with breakfast., Disp: , Rfl:    warfarin (COUMADIN) 5 MG tablet, Take 1 tablet by mouth daily  at 12 noon., Disp: , Rfl:    albuterol (VENTOLIN HFA) 108 (90 Base) MCG/ACT inhaler, Inhale 2 puffs into the lungs daily as needed for wheezing. (Patient not taking: Reported on 10/12/2021), Disp: , Rfl:    fluticasone (FLONASE) 50 MCG/ACT nasal spray, Place 2 sprays into the nose daily at 12 noon. (Patient not taking: Reported on 11/08/2021), Disp: , Rfl:    ibuprofen (ADVIL) 600 MG tablet, Take 600 mg by mouth every 6 (six) hours. (Patient not taking: Reported on 10/12/2021), Disp: , Rfl:    umeclidinium-vilanterol (ANORO ELLIPTA) 62.5-25 MCG/ACT AEPB, Inhale into the lungs. (Patient not taking: Reported on 11/08/2021), Disp: , Rfl:   Physical exam:  Vitals:   02/20/23 0948  BP: 123/76  Pulse: 63  Resp: 18  Temp: (!) 97.5 F (36.4 C)  TempSrc: Tympanic  SpO2: 98%  Weight: 218 lb 9.6 oz (99.2 kg)  Height: 6\' 1"  (1.854 m)   Physical Exam Cardiovascular:     Rate and Rhythm: Normal rate and regular rhythm.     Heart sounds: Normal heart sounds.  Pulmonary:     Effort: Pulmonary effort is normal.     Breath sounds: Normal breath sounds.  Abdominal:     General: Bowel sounds are normal.     Palpations: Abdomen is soft.  Skin:    General: Skin is warm and dry.  Neurological:     Mental Status: He is alert and oriented to person, place, and time.         Latest Ref Rng & Units 02/20/2023    9:37 AM  CMP  Glucose 70 - 99 mg/dL 629   BUN 8 - 23 mg/dL 20   Creatinine 5.28 - 1.24 mg/dL 4.13   Sodium 244 - 010 mmol/L 134   Potassium 3.5 - 5.1 mmol/L 3.9   Chloride 98 - 111 mmol/L 101   CO2 22 - 32 mmol/L 25   Calcium 8.9 - 10.3 mg/dL 9.1   Total Protein 6.5 - 8.1 g/dL 7.7   Total Bilirubin 0.3 - 1.2 mg/dL 1.2   Alkaline Phos 38 - 126 U/L 64   AST 15 - 41 U/L 26   ALT 0 - 44 U/L 25       Latest Ref Rng & Units 02/20/2023    9:37 AM  CBC  WBC 4.0 -  10.5 K/uL 8.0   Hemoglobin 13.0 - 17.0 g/dL 16.1   Hematocrit 09.6 - 52.0 % 42.2   Platelets 150 - 400 K/uL 187      Assessment  and plan- Patient is a 77 y.o. male here for routine f/u of IgA kappa MGUS.   Patient does not have any evidence of AKI hypercalcemia or anemia.Myeloma labs and serum free light chains are currently pending.  Previous labs from March 2024 showed stable protein of 0.4 g.  Serum free kappa light chain was elevated at 375 similar to what it was a year ago and the ratio has been fluctuating between 17-25 without a clear rising trend.  And that his bone marrow biopsy showed 10% plasma cells I am still categorizing him as having MGUS and not smoldering multiple myeloma.  CBC with differential, CMP, myeloma panel and serum free light chains and 4 months and 8 months and I will see him back in 8 months   Visit Diagnosis 1. MGUS (monoclonal gammopathy of unknown significance)      Dr. Owens Shark, MD, MPH [facility] [phone] 02/20/2023 12:35 PM

## 2023-02-23 LAB — MULTIPLE MYELOMA PANEL, SERUM: Total Protein ELP: 7 g/dL (ref 6.0–8.5)

## 2023-06-22 ENCOUNTER — Inpatient Hospital Stay: Payer: Medicare Other | Attending: Oncology

## 2023-06-22 DIAGNOSIS — D472 Monoclonal gammopathy: Secondary | ICD-10-CM | POA: Diagnosis present

## 2023-06-22 LAB — COMPREHENSIVE METABOLIC PANEL
ALT: 24 U/L (ref 0–44)
AST: 27 U/L (ref 15–41)
Albumin: 4.5 g/dL (ref 3.5–5.0)
Alkaline Phosphatase: 58 U/L (ref 38–126)
Anion gap: 10 (ref 5–15)
BUN: 20 mg/dL (ref 8–23)
CO2: 29 mmol/L (ref 22–32)
Calcium: 9.8 mg/dL (ref 8.9–10.3)
Chloride: 100 mmol/L (ref 98–111)
Creatinine, Ser: 1.16 mg/dL (ref 0.61–1.24)
GFR, Estimated: >60 mL/min (ref >60)
Glucose, Bld: 129 mg/dL — ABNORMAL HIGH (ref 70–99)
Potassium: 3.7 mmol/L (ref 3.5–5.1)
Sodium: 139 mmol/L (ref 135–145)
Total Bilirubin: 1.5 mg/dL — ABNORMAL HIGH (ref <1.2)
Total Protein: 8.1 g/dL (ref 6.5–8.1)

## 2023-06-22 LAB — CBC WITH DIFFERENTIAL/PLATELET
Abs Immature Granulocytes: 0.03 10*3/uL (ref 0.00–0.07)
Basophils Absolute: 0 10*3/uL (ref 0.0–0.1)
Basophils Relative: 0 %
Eosinophils Absolute: 0.3 10*3/uL (ref 0.0–0.5)
Eosinophils Relative: 4 %
HCT: 44.3 % (ref 39.0–52.0)
Hemoglobin: 15.2 g/dL (ref 13.0–17.0)
Immature Granulocytes: 0 %
Lymphocytes Relative: 12 %
Lymphs Abs: 0.8 10*3/uL (ref 0.7–4.0)
MCH: 29.1 pg (ref 26.0–34.0)
MCHC: 34.3 g/dL (ref 30.0–36.0)
MCV: 84.9 fL (ref 80.0–100.0)
Monocytes Absolute: 0.6 10*3/uL (ref 0.1–1.0)
Monocytes Relative: 8 %
Neutro Abs: 5.4 10*3/uL (ref 1.7–7.7)
Neutrophils Relative %: 76 %
Platelets: 186 10*3/uL (ref 150–400)
RBC: 5.22 MIL/uL (ref 4.22–5.81)
RDW: 13.9 % (ref 11.5–15.5)
WBC: 7.2 10*3/uL (ref 4.0–10.5)
nRBC: 0 % (ref 0.0–0.2)

## 2023-06-25 LAB — KAPPA/LAMBDA LIGHT CHAINS
Kappa free light chain: 391 mg/L — ABNORMAL HIGH (ref 3.3–19.4)
Kappa, lambda light chain ratio: 20.36 — ABNORMAL HIGH (ref 0.26–1.65)
Lambda free light chains: 19.2 mg/L (ref 5.7–26.3)

## 2023-06-28 DIAGNOSIS — F32A Depression, unspecified: Secondary | ICD-10-CM | POA: Insufficient documentation

## 2023-06-28 DIAGNOSIS — R768 Other specified abnormal immunological findings in serum: Secondary | ICD-10-CM | POA: Insufficient documentation

## 2023-06-29 LAB — MULTIPLE MYELOMA PANEL, SERUM
Albumin SerPl Elph-Mcnc: 4.2 g/dL (ref 2.9–4.4)
Albumin/Glob SerPl: 1.3 (ref 0.7–1.7)
Alpha 1: 0.2 g/dL (ref 0.0–0.4)
Alpha2 Glob SerPl Elph-Mcnc: 1 g/dL (ref 0.4–1.0)
B-Globulin SerPl Elph-Mcnc: 1 g/dL (ref 0.7–1.3)
Gamma Glob SerPl Elph-Mcnc: 1.3 g/dL (ref 0.4–1.8)
Globulin, Total: 3.5 g/dL (ref 2.2–3.9)
IgA: 557 mg/dL — ABNORMAL HIGH (ref 61–437)
IgG (Immunoglobin G), Serum: 976 mg/dL (ref 603–1613)
IgM (Immunoglobulin M), Srm: 35 mg/dL (ref 15–143)
M Protein SerPl Elph-Mcnc: 0.4 g/dL — ABNORMAL HIGH
Total Protein ELP: 7.7 g/dL (ref 6.0–8.5)

## 2023-10-22 ENCOUNTER — Inpatient Hospital Stay: Payer: Medicare Other | Attending: Oncology

## 2023-10-22 ENCOUNTER — Encounter: Payer: Self-pay | Admitting: Nurse Practitioner

## 2023-10-22 ENCOUNTER — Inpatient Hospital Stay (HOSPITAL_BASED_OUTPATIENT_CLINIC_OR_DEPARTMENT_OTHER): Payer: Medicare Other | Admitting: Nurse Practitioner

## 2023-10-22 VITALS — BP 151/75 | HR 65 | Temp 97.5°F | Resp 16 | Wt 215.0 lb

## 2023-10-22 DIAGNOSIS — J4489 Other specified chronic obstructive pulmonary disease: Secondary | ICD-10-CM | POA: Insufficient documentation

## 2023-10-22 DIAGNOSIS — D472 Monoclonal gammopathy: Secondary | ICD-10-CM | POA: Diagnosis not present

## 2023-10-22 DIAGNOSIS — Z87891 Personal history of nicotine dependence: Secondary | ICD-10-CM | POA: Diagnosis not present

## 2023-10-22 LAB — CBC WITH DIFFERENTIAL/PLATELET
Abs Immature Granulocytes: 0.04 10*3/uL (ref 0.00–0.07)
Basophils Absolute: 0 10*3/uL (ref 0.0–0.1)
Basophils Relative: 0 %
Eosinophils Absolute: 0.3 10*3/uL (ref 0.0–0.5)
Eosinophils Relative: 4 %
HCT: 45.9 % (ref 39.0–52.0)
Hemoglobin: 15.7 g/dL (ref 13.0–17.0)
Immature Granulocytes: 1 %
Lymphocytes Relative: 11 %
Lymphs Abs: 0.8 10*3/uL (ref 0.7–4.0)
MCH: 29.5 pg (ref 26.0–34.0)
MCHC: 34.2 g/dL (ref 30.0–36.0)
MCV: 86.3 fL (ref 80.0–100.0)
Monocytes Absolute: 0.7 10*3/uL (ref 0.1–1.0)
Monocytes Relative: 9 %
Neutro Abs: 5.7 10*3/uL (ref 1.7–7.7)
Neutrophils Relative %: 75 %
Platelets: 192 10*3/uL (ref 150–400)
RBC: 5.32 MIL/uL (ref 4.22–5.81)
RDW: 13.9 % (ref 11.5–15.5)
WBC: 7.5 10*3/uL (ref 4.0–10.5)
nRBC: 0 % (ref 0.0–0.2)

## 2023-10-22 LAB — COMPREHENSIVE METABOLIC PANEL WITH GFR
ALT: 21 U/L (ref 0–44)
AST: 27 U/L (ref 15–41)
Albumin: 4.1 g/dL (ref 3.5–5.0)
Alkaline Phosphatase: 79 U/L (ref 38–126)
Anion gap: 12 (ref 5–15)
BUN: 21 mg/dL (ref 8–23)
CO2: 26 mmol/L (ref 22–32)
Calcium: 9.4 mg/dL (ref 8.9–10.3)
Chloride: 99 mmol/L (ref 98–111)
Creatinine, Ser: 1.15 mg/dL (ref 0.61–1.24)
GFR, Estimated: >60 mL/min (ref >60)
Glucose, Bld: 90 mg/dL (ref 70–99)
Potassium: 3.8 mmol/L (ref 3.5–5.1)
Sodium: 137 mmol/L (ref 135–145)
Total Bilirubin: 1.8 mg/dL — ABNORMAL HIGH (ref 0.0–1.2)
Total Protein: 7.8 g/dL (ref 6.5–8.1)

## 2023-10-22 NOTE — Progress Notes (Signed)
 Hematology/Oncology Consult Note [facility]  [phone] [phone]  Patient Care Team: Karl Bales, MD as PCP - General (Internal Medicine) Creig Hines, MD as Consulting Physician (Hematology)   Name of the patient: Aaron Carr  [id number]  Jul 02, 1946   Date of visit: 10/22/23  Diagnosis- IgA kappa MGUS  Chief complaint/ Reason for visit- routine follow-up of MGUS  Heme/Onc history: Patient presented as a 78 year old male with a past medical history significant for CKD, CAD, BPH, hyperlipidemia who has been referred for MGUS.  Blood work from 09/07/2021 showed a normal CBC with an H&H of 15.4/45.8.  Iron studies showed a normal TIBC SPEP showed a small M spike of 0.38 g B12 and folate were normal.  Immunofixation showed IgA kappa.  BMP showed mildly elevated serum calcium of 10.3 with a creatinine of 1.2   Results of blood work from 4 11/27/2018 showed a normal CBC with an H&H of 15.4/44.4. Serum creatinine is normal at 1.1 with a calcium of 9.3.  Total protein normal at 7.9. Myeloma panel shows a small IgA kappa M protein of 0.4 g.  However kappa light chain ratio is elevated at 25 with an elevated serum free kappa of 392. Results of bone marrow biopsy showed hypercellular marrow involved with plasma cell neoplasm 10%. 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     Interval history- Patient is 78 year old male with above history of MGUS who returns to clinic for follow up. He plans to move to [place] later this year d/t preference. His son remains in [place] with his 2 granddaughters. He clinically feels well. Denies new aches and pains. Denies changes in appetite or weight. Continues to remain active. Continues to be followed by other specialists for his comorbidities.   ECOG PS- 1 Pain scale- 0  Review of systems- Review of Systems  Constitutional:  Negative for chills, fever, malaise/fatigue and weight  loss.  HENT:  Negative for congestion, ear discharge and nosebleeds.   Eyes:  Negative for blurred vision.  Respiratory:  Negative for cough, hemoptysis, sputum production, shortness of breath and wheezing.   Cardiovascular:  Negative for chest pain, palpitations, orthopnea and claudication.  Gastrointestinal:  Negative for abdominal pain, blood in stool, constipation, diarrhea, heartburn, melena, nausea and vomiting.  Genitourinary:  Negative for dysuria, flank pain, frequency, hematuria and urgency.  Musculoskeletal:  Negative for back pain, joint pain and myalgias.  Skin:  Negative for rash.  Neurological:  Negative for dizziness, tingling, focal weakness, seizures, weakness and headaches.  Endo/Heme/Allergies:  Does not bruise/bleed easily.  Psychiatric/Behavioral:  Negative for depression and suicidal ideas. The patient does not have insomnia.     No Known Allergies  Past Medical History:  Diagnosis Date   Albuminuria 02/16/2014   Benign prostatic hyperplasia 10/11/2010   Biventricular cardiac pacemaker in situ    St Jude Allure [place] RF [id number] CRT-P (serial number [id number]) implanted 12/15/2013   CAD (coronary artery disease)    Cardiac device in situ 03/16/2021   St Jude CRT-P implanted on 02/16/21 with St Jude RA, RV and LV leads implanted 12/15/2013   Carotid artery plaque, bilateral 02/27/2019   Chronic kidney disease (CKD) stage G3a/A1, moderately decreased glomerular filtration rate (GFR) between 45-59 mL/min/1.73 square meter and albuminuria creatinine ratio less than 30 mg/g (HCC) 09/17/2014   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   Ex-smoker    GERD (gastroesophageal reflux disease) 12/10/2013  Glaucoma    H/O syncope 12/10/2013   Head trauma 02/13/2012   History of Mohs surgery for squamous cell carcinoma in situ of skin 05/2016   Hypokalemia 02/16/2014   Long term current use of anticoagulant therapy    Mixed hyperlipidemia    NSVT (nonsustained  ventricular tachycardia) (HCC) 01/08/2018   OSA on CPAP    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 No pertinent surgical history.  Social History   Socioeconomic History   Marital status: Single    Spouse name: Not on file   Number of children: Not on file   Years of education: Not on file   Highest education level: Not on file  Occupational History   Not on file  Tobacco Use   Smoking status: Former    Current packs/day: 0.00    Average packs/day: 3.0 packs/day for 30.0 years (90.0 ttl pk-yrs)    Types: Cigarettes    Start date: 07/10/1968    Quit date: 07/09/1998    Years since quitting: 25.3    Passive exposure: Never   Smokeless tobacco: Never  Vaping Use   Vaping status: Never Used  Substance and Sexual Activity   Alcohol use: Not Currently    Alcohol/week: 1.0 standard drink of alcohol    Types: 1 Glasses of wine per week    Comment: "social drinker wine consumption" 2-4 times a month   Drug use: Never   Sexual activity: Not on file  Other Topics Concern   Not on file  Social History Narrative   Not on file   Social Drivers of Health   Financial Resource Strain: Medium Risk (01/08/2023)   Received from [facility]   Overall Financial Resource Strain (CARDIA)    Difficulty of Paying Living Expenses: Somewhat hard  Food Insecurity: No Food Insecurity (01/08/2023)   Received from [facility]   Hunger  Vital Sign    Worried About Running Out of Food in the Last Year: Never true    Ran Out of Food in the Last Year: Never true  Transportation Needs: No Transportation Needs (01/08/2023)   Received from [facility] - Transportation    In the past 12 months, has lack of transportation kept you from medical appointments or from getting medications?: No    Lack of Transportation (Non-Medical): No  Physical Activity: Sufficiently Active (08/03/2020)   Received from [facility], [facility]   Exercise Vital Sign    Days of Exercise per Week: 7 days    Minutes of Exercise per Session: 30 min  Stress: No Stress Concern Present (08/03/2020)   Received from [facility], [facility]   [employer] of Occupational Health - Occupational Stress Questionnaire    Feeling of Stress : Not at all  Social Connections: Moderately Integrated (08/03/2020)   Received from [facility], [facility]   Social Connection  and Isolation Panel [NHANES]    Frequency of Communication with Friends and Family: More than three times a week    Frequency of Social Gatherings with Friends and Family: More than three times a week    Attends Religious Services: More than 4 times per year    Active Member of [employer] or Organizations: Yes    Attends Engineer, structural: More than 4 times per year    Marital Status: Divorced  Catering manager Violence: Not on file   Family History  Problem Relation Age of Onset   Emphysema Father    Glaucoma Father    Bipolar disorder Brother    Skin cancer Brother    Skin cancer Brother     Current Outpatient Medications:    amLODipine (NORVASC) 10 MG tablet, Take 1 tablet by mouth daily., Disp: , Rfl:    atorvastatin (LIPITOR) 40 MG tablet, Take 1 tablet by mouth daily at 12 noon., Disp: , Rfl:    doxazosin (CARDURA) 4 MG tablet, Take 1 tablet  by mouth daily at 12 noon., Disp: , Rfl:    losartan (COZAAR) 25 MG tablet, Take 1 tablet by mouth daily., Disp: , Rfl:    metformin (FORTAMET) 500 MG (OSM) 24 hr tablet, Take 500 mg by mouth daily with breakfast., Disp: , Rfl:    metoprolol succinate (TOPROL-XL) 25 MG 24 hr tablet, Take 1 tablet by mouth daily., Disp: , Rfl:    warfarin (COUMADIN) 5 MG tablet, Take 1 tablet by mouth daily at 12 noon., Disp: , Rfl:   Physical exam:  Vitals:   10/22/23 1016  BP: (!) 151/75  Pulse: 65  Resp: 16  Temp: (!) 97.5 F (36.4 C)  TempSrc: Oral  SpO2: 100%  Weight: 215 lb (97.5 kg)   Physical Exam Cardiovascular:     Rate and Rhythm: Normal rate and regular rhythm.     Heart sounds: Normal heart sounds.  Pulmonary:     Effort: Pulmonary effort is normal.     Breath sounds: Normal breath sounds.  Abdominal:     General: Bowel sounds are normal.     Palpations: Abdomen is soft.  Skin:    General: Skin is warm and dry.  Neurological:     Mental Status: He is alert and oriented to person, place, and time.        Latest Ref Rng & Units 10/22/2023   10:03 AM  CMP  Glucose 70 - 99 mg/dL 90   BUN 8 - 23 mg/dL 21   Creatinine 1.61 - 1.24 mg/dL 0.96   Sodium 045 - 409 mmol/L 137   Potassium 3.5 - 5.1 mmol/L 3.8   Chloride 98 - 111 mmol/L 99   CO2 22 - 32 mmol/L 26   Calcium 8.9 - 10.3 mg/dL 9.4   Total Protein 6.5 - 8.1 g/dL 7.8   Total Bilirubin 0.0 - 1.2 mg/dL 1.8   Alkaline Phos 38 - 126 U/L 79   AST 15 - 41 U/L 27   ALT 0 - 44 U/L 21       Latest Ref Rng & Units 10/22/2023   10:03 AM  CBC  WBC 4.0 - 10.5 K/uL 7.5   Hemoglobin 13.0 - 17.0 g/dL 81.1   Hematocrit 91.4 - 52.0 % 45.9   Platelets 150 - 400 K/uL 192     Assessment and plan- Patient is a 78 y.o. male   IgA kappa MGUS- 09/2022 showed stable M protein of 0.4g. Serum  kappa free light chain was elevated at 391 but stable over past couple of years fluctuating from 304-392 without a clear rising trend. Additionally the  kappa lambda light chain ratio fluctuates from 17-26 without clear trend. He previously had a bone marrow biopsy that showed 10% plasma cells however, still categorized as MGUS as opposed to smoldering myeloma. He does not have any evidence of end organ damage. SPEP and light chains are pending at time of visit.   He plans to move to [place]  in a few months. He will notify our clinic when he has established care so that we might transfer care to new provider. Recommend repeating labs in 8-12 months. For now, no follow up locally unless needed in interim.   It has been our pleasure to care for this very pleasant patient.    Visit Diagnosis 1. MGUS (monoclonal gammopathy of unknown significance)    Kenney Peacemaker, DNP, AGNP-C, AOCNP [facility] [phone] (clinic) 10/22/2023

## 2023-10-23 LAB — KAPPA/LAMBDA LIGHT CHAINS
Kappa free light chain: 386.6 mg/L — ABNORMAL HIGH (ref 3.3–19.4)
Kappa, lambda light chain ratio: 23.72 — ABNORMAL HIGH (ref 0.26–1.65)
Lambda free light chains: 16.3 mg/L (ref 5.7–26.3)

## 2023-10-24 LAB — MULTIPLE MYELOMA PANEL, SERUM
Albumin SerPl Elph-Mcnc: 3.8 g/dL (ref 2.9–4.4)
Albumin/Glob SerPl: 1.1 (ref 0.7–1.7)
Alpha 1: 0.3 g/dL (ref 0.0–0.4)
Alpha2 Glob SerPl Elph-Mcnc: 1 g/dL (ref 0.4–1.0)
B-Globulin SerPl Elph-Mcnc: 1 g/dL (ref 0.7–1.3)
Gamma Glob SerPl Elph-Mcnc: 1.2 g/dL (ref 0.4–1.8)
Globulin, Total: 3.5 g/dL (ref 2.2–3.9)
IgA: 503 mg/dL — ABNORMAL HIGH (ref 61–437)
IgG (Immunoglobin G), Serum: 988 mg/dL (ref 603–1613)
IgM (Immunoglobulin M), Srm: 33 mg/dL (ref 15–143)
M Protein SerPl Elph-Mcnc: 0.4 g/dL — ABNORMAL HIGH
Total Protein ELP: 7.3 g/dL (ref 6.0–8.5)

## 2023-11-18 IMAGING — CT CT BIOPSY AND ASPIRATION BONE MARROW
1 of 2 series · 10 of 14 positions shown, 13 images · non-contrast
Comparison: none

INDICATION: Monoclonal gammopathy of unknown significance.

[Series 2: i-spiral 5.0 br38 · axial · 0.92mm/px · z∈[+954,+1007]mm · 10 of 19 slices shown, 13 images]
[im 2/19  soft-tissue]
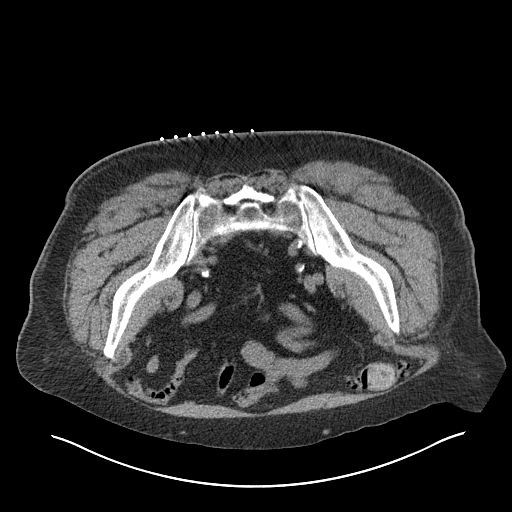
[im 2/19  bone]
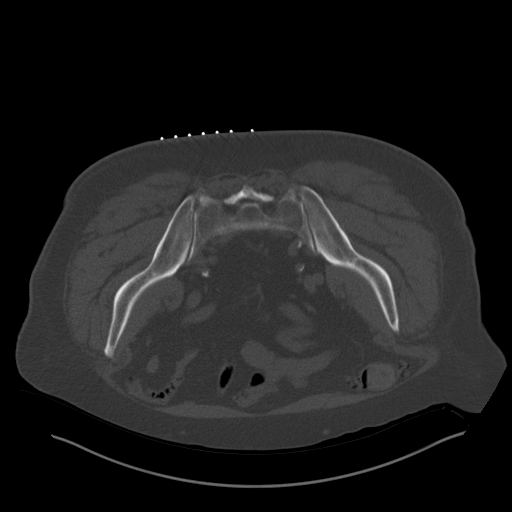
[im 4/19  bone]
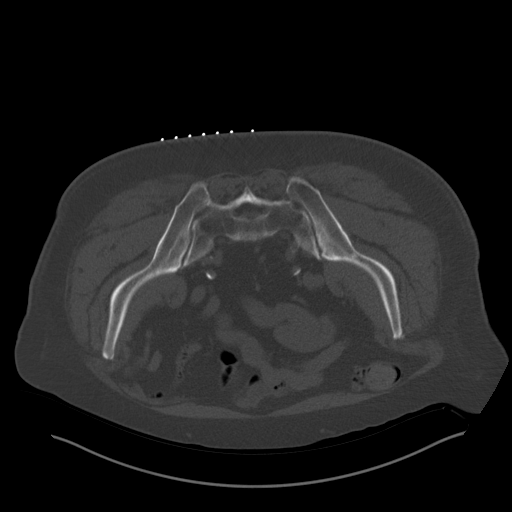
[im 5/19  bone]
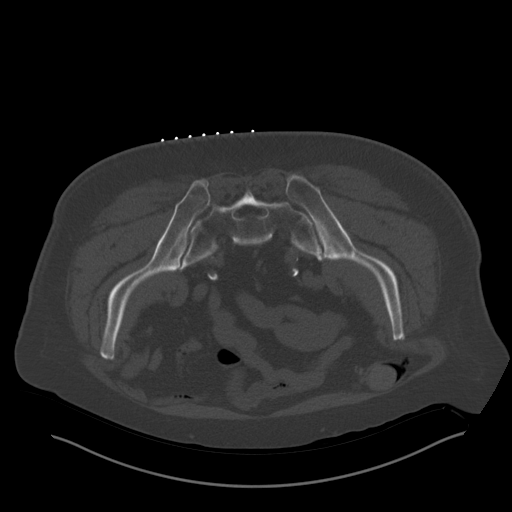
[im 7/19  bone]
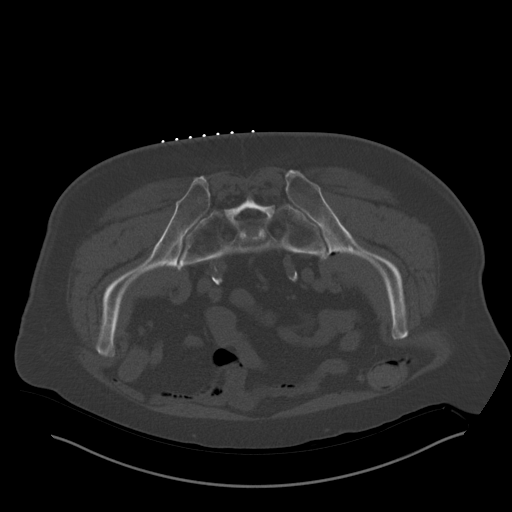
[im 9/19  soft-tissue]
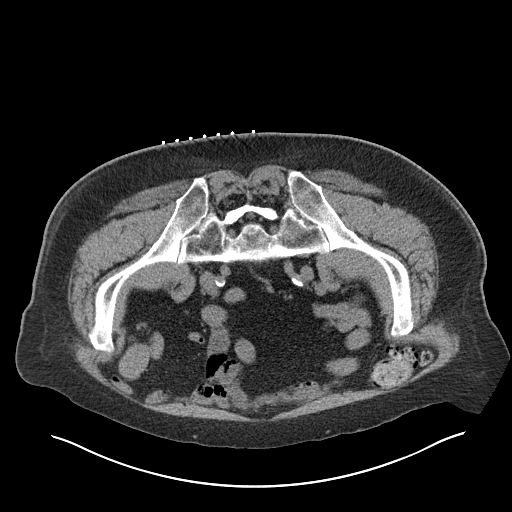
[im 9/19  bone]
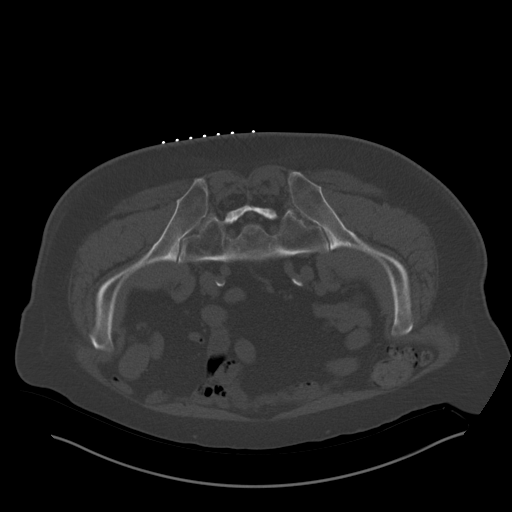
[im 10/19  bone]
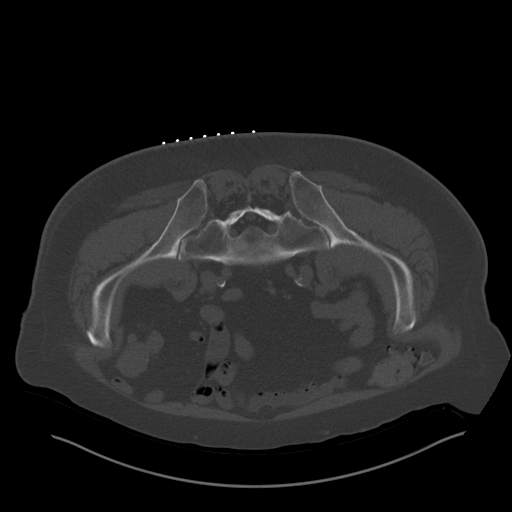
[im 12/19  bone]
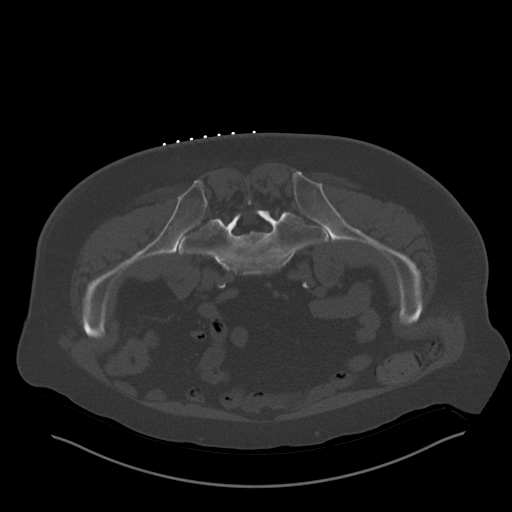
[im 14/19  bone]
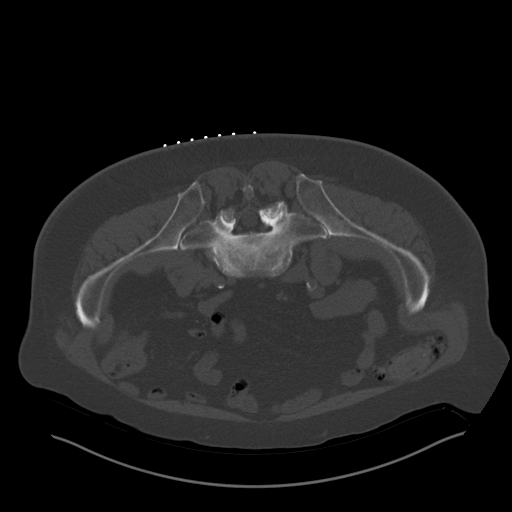
[im 15/19  soft-tissue]
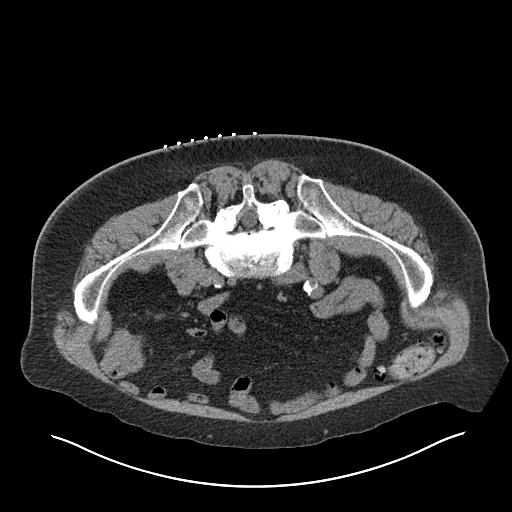
[im 15/19  bone]
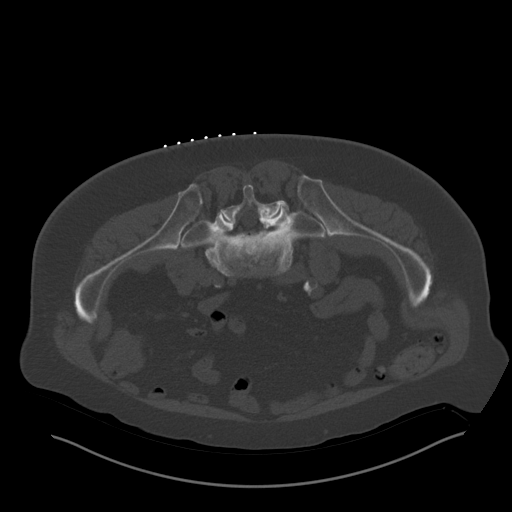
[im 17/19  bone]
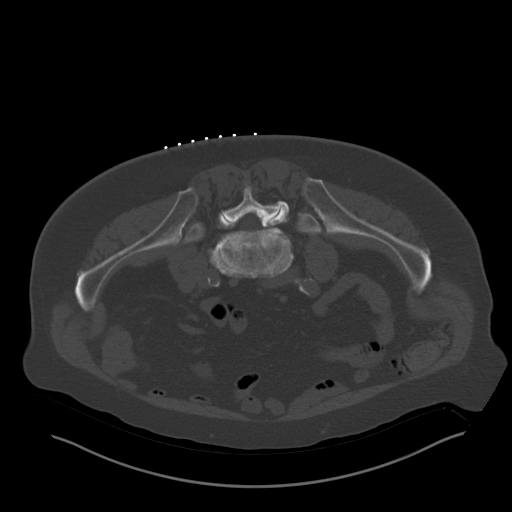

[10 of 14 positions shown; findings below may reference images not displayed]

EXAM:
CT GUIDED BONE MARROW ASPIRATES AND BIOPSY

MEDICATIONS:
None.

ANESTHESIA/SEDATION:
Moderate (conscious) sedation was employed during this procedure. A
total of Versed 1.0mg and fentanyl 50 mcg was administered
intravenously at the order of the provider performing the procedure.

Total intra-service moderate sedation time: 10 minutes.

Patient's level of consciousness and vital signs were monitored
continuously by radiology nurse throughout the procedure under the
supervision of the provider performing the procedure.

COMPLICATIONS:
None immediate.

PROCEDURE:
The procedure was explained to the patient. The risks and benefits
of the procedure were discussed and the patient's questions were
addressed. Informed consent was obtained from the patient. The
patient was placed prone on CT table. Images of the pelvis were
obtained. The right side of back was prepped and draped in sterile
fashion. The skin and right posterior ilium were anesthetized with
1% lidocaine. 11 gauge bone needle was directed into the right ilium
with CT guidance. Two aspirates and two core biopsy were obtained.
Bandage placed over the puncture site.
FINDINGS: Biopsy needle directed in the posterior right ilium.
IMPRESSION: CT guided bone marrow aspiration and core biopsy.
